# Patient Record
Sex: Male | Born: 1965 | Race: Black or African American | Hispanic: No | Smoking: Current every day smoker
Health system: Southern US, Community
[De-identification: ages and names within clinical notes are randomized; demographics above are authoritative.]

## PROBLEM LIST (undated history)

## (undated) HISTORY — PX: LEG SURGERY: SHX1003

---

## 2006-04-17 ENCOUNTER — Inpatient Hospital Stay (HOSPITAL_COMMUNITY): Admission: EM | Admit: 2006-04-17 | Discharge: 2006-04-19 | Payer: Self-pay | Admitting: Emergency Medicine

## 2006-04-23 ENCOUNTER — Ambulatory Visit (HOSPITAL_COMMUNITY): Admission: RE | Admit: 2006-04-23 | Discharge: 2006-04-24 | Payer: Self-pay | Admitting: Orthopedic Surgery

## 2006-06-18 ENCOUNTER — Encounter: Admission: RE | Admit: 2006-06-18 | Discharge: 2006-07-20 | Payer: Self-pay | Admitting: Orthopedic Surgery

## 2006-08-24 ENCOUNTER — Encounter: Admission: RE | Admit: 2006-08-24 | Discharge: 2006-08-24 | Payer: Self-pay | Admitting: Orthopedic Surgery

## 2007-02-08 ENCOUNTER — Emergency Department (HOSPITAL_COMMUNITY): Admission: EM | Admit: 2007-02-08 | Discharge: 2007-02-08 | Payer: Self-pay | Admitting: Emergency Medicine

## 2007-06-10 IMAGING — CR DG TIBIA/FIBULA PORT 2V*L*
2 series · 2 of 2 positions shown · non-contrast
Comparison: none

HISTORY: Tibial plateau fracture status post ORIF

[view not recorded (1 of 2)]
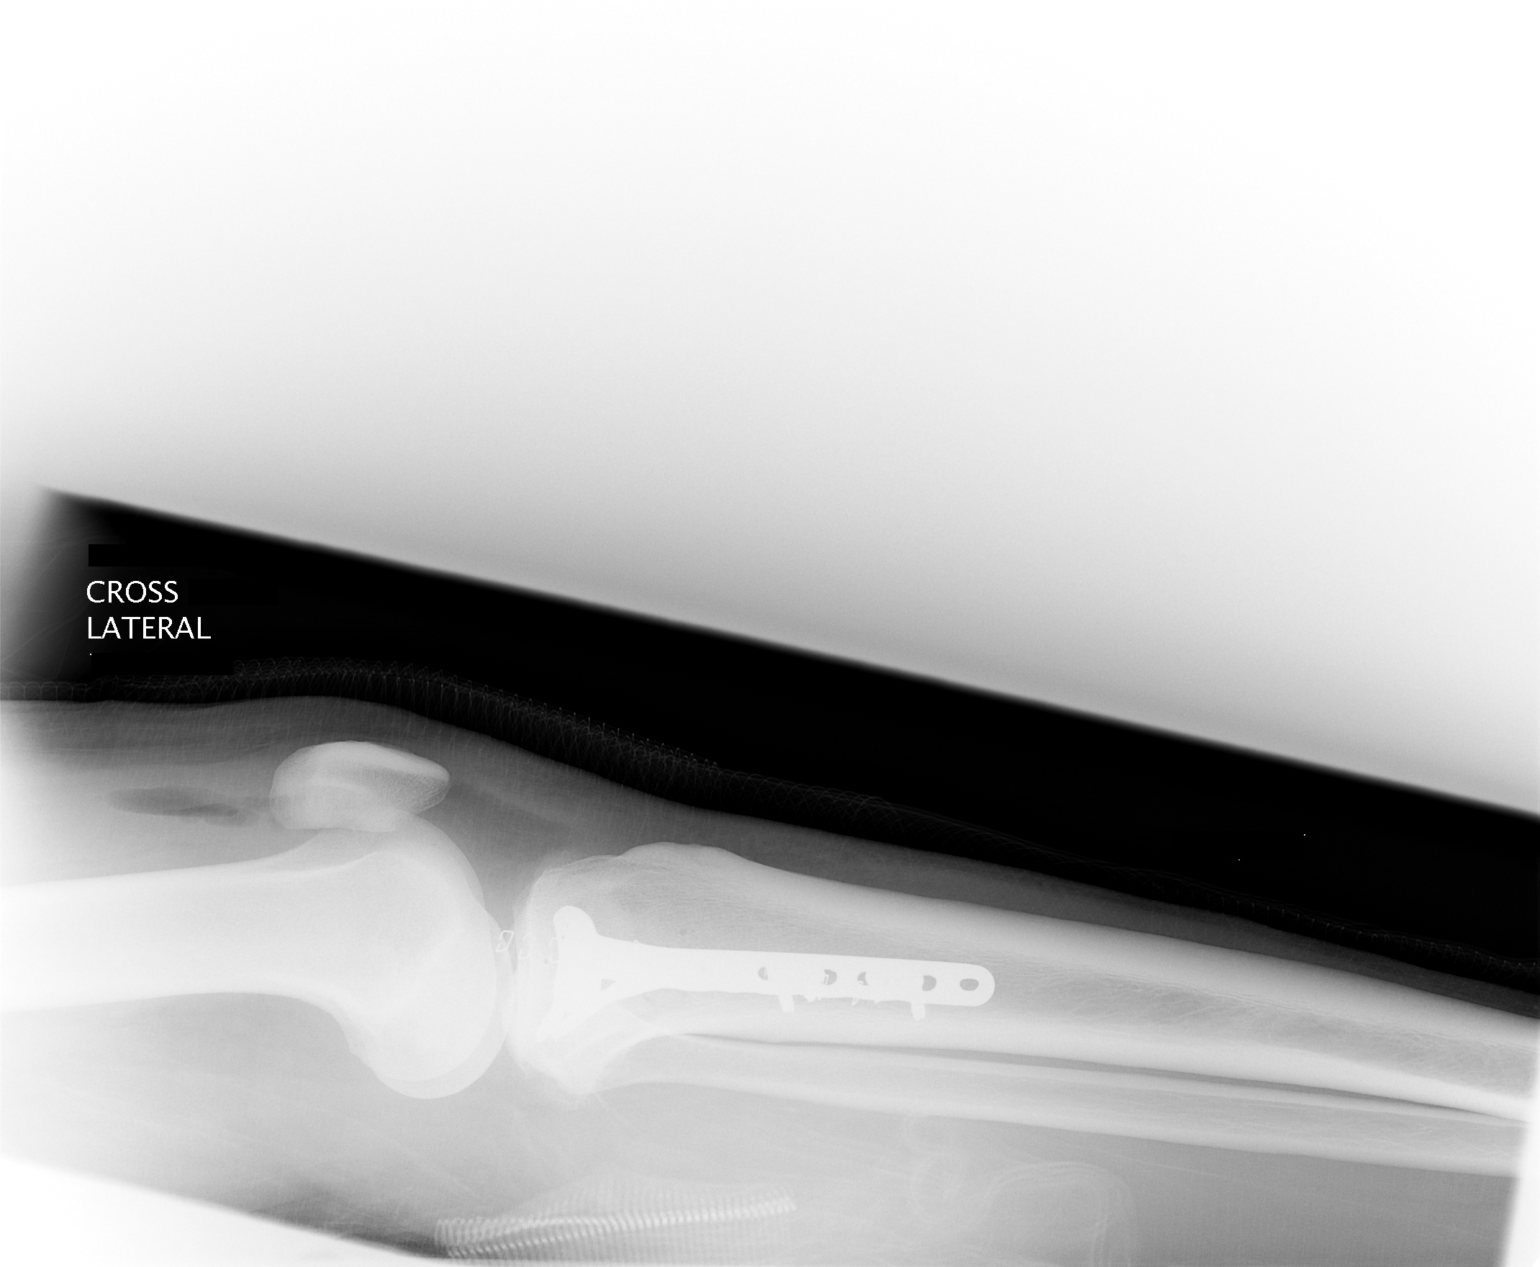

[view not recorded (2 of 2)]
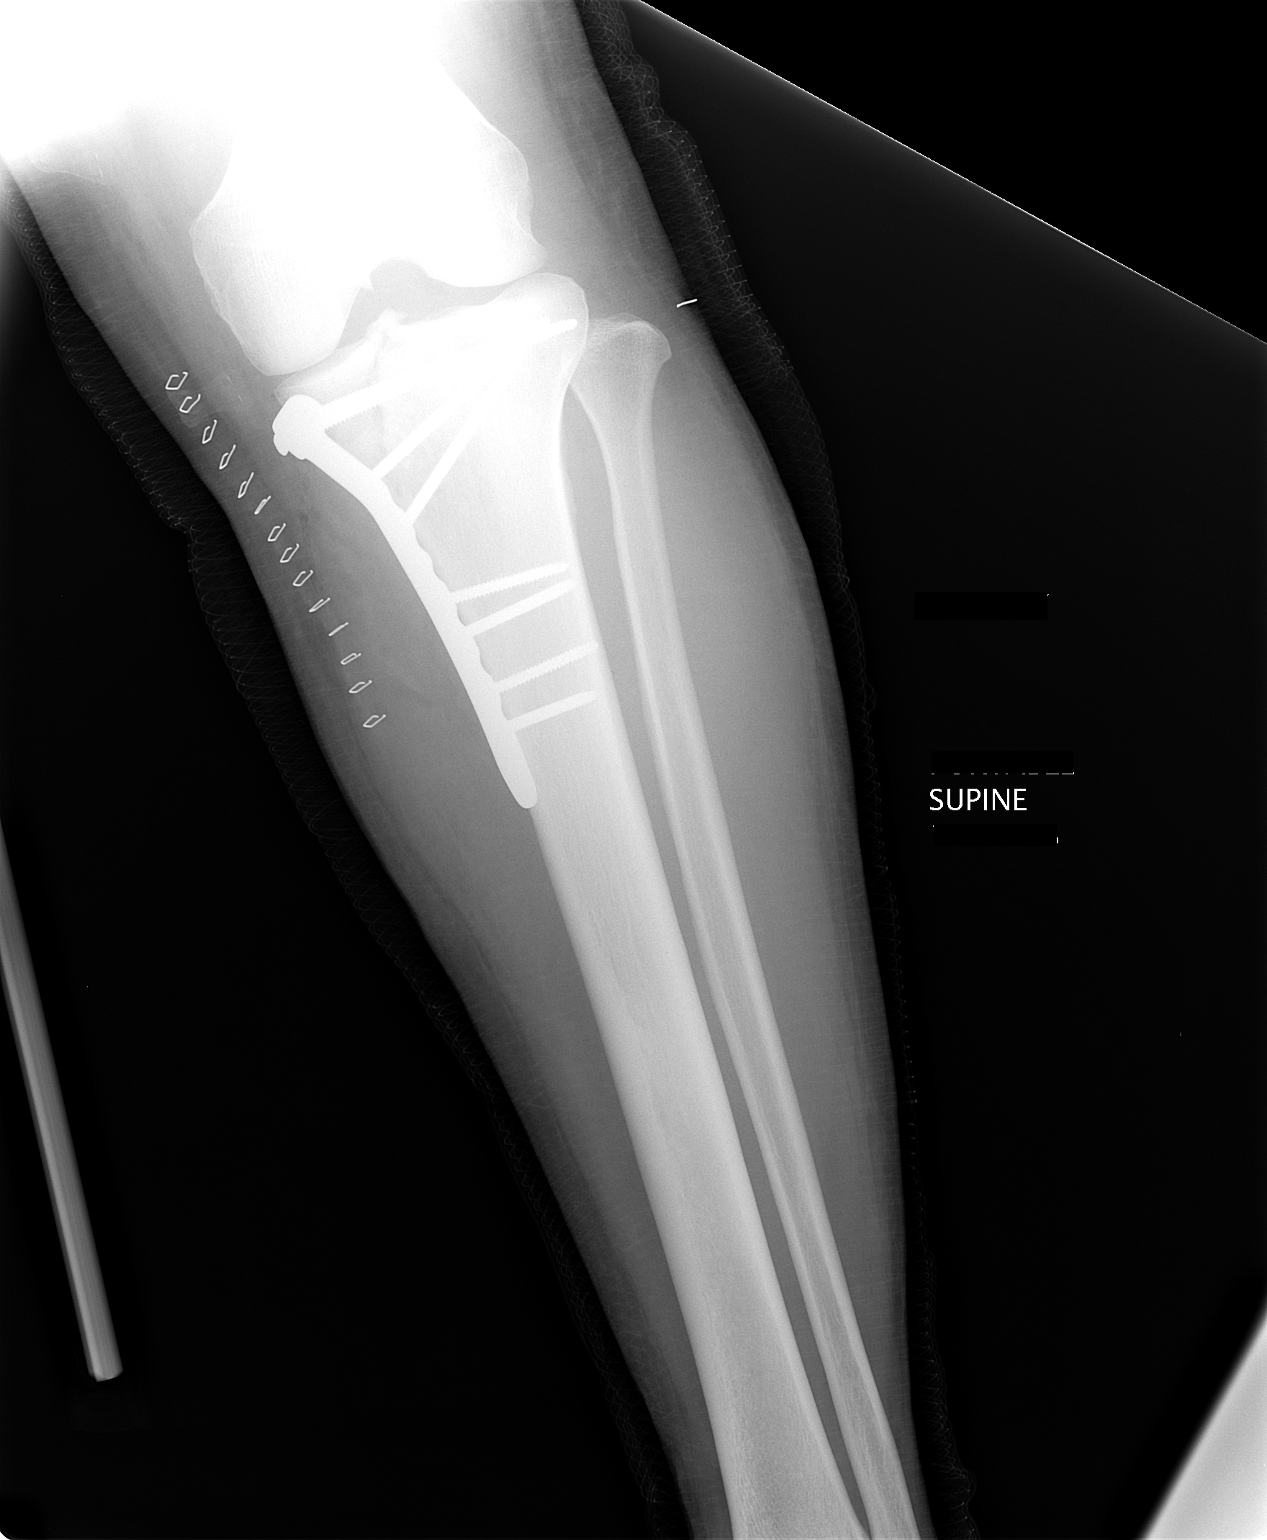

[2 of 2 positions shown; findings below may reference images not displayed]

LEFT TIBIA AND FIBULA 2 VIEWS PORTABLE:

Portal exam 2421 hours.
Medial buttress plate and 7 screws across reduced tibial plateau fracture.
Overlying skin clips and soft tissue surgical changes.
Small amount of gas seen within knee joint compatible with surgery.
No additional fracture seen
Question bone fragment projecting at intercondylar notch on AP view.
IMPRESSION: Status post ORIF of tibial plateau fracture.
Question intra-articular bone fragment at intercondylar notch.
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
## 2011-09-22 ENCOUNTER — Emergency Department (HOSPITAL_COMMUNITY): Payer: Self-pay

## 2011-09-22 ENCOUNTER — Emergency Department (HOSPITAL_COMMUNITY)
Admission: EM | Admit: 2011-09-22 | Discharge: 2011-09-22 | Disposition: A | Discharge status: Home / Self Care | Payer: Self-pay | Attending: Emergency Medicine | Admitting: Emergency Medicine

## 2011-09-22 ENCOUNTER — Encounter: Payer: Self-pay | Admitting: *Deleted

## 2011-09-22 DIAGNOSIS — G8929 Other chronic pain: Secondary | ICD-10-CM | POA: Insufficient documentation

## 2011-09-22 DIAGNOSIS — M79609 Pain in unspecified limb: Secondary | ICD-10-CM | POA: Insufficient documentation

## 2011-09-22 DIAGNOSIS — M25569 Pain in unspecified knee: Secondary | ICD-10-CM | POA: Insufficient documentation

## 2011-09-22 MED ORDER — HYDROCODONE-ACETAMINOPHEN 5-325 MG PO TABS: 1.0000 | ORAL_TABLET | ORAL | Status: AC | PRN

## 2011-09-22 NOTE — ED Provider Notes (Signed)
History     CSN: [id number]  Arrival date & time 09/22/11  1243   First MD Initiated Contact with Patient 09/22/11 1511      Chief Complaint  Patient presents with  . Leg Pain    (Consider location/radiation/quality/duration/timing/severity/associated sxs/prior treatment) Patient is a 45 y.o. male presenting with leg pain. The history is provided by the patient. No language interpreter was used.  Leg Pain  The incident occurred more than 1 week ago. The incident occurred at home. There was no injury mechanism. The pain is present in the left leg and left knee. The quality of the pain is described as aching. The pain is severe. The pain has been fluctuating since onset. Pertinent negatives include no inability to bear weight, no loss of motion and no loss of sensation. He reports no foreign bodies present. He has tried nothing for the symptoms. The treatment provided no relief.  Patient reports knee surgery in 2008.  Has been experiencing chronic pain.  Recent exacerbation of worsening pain.  History reviewed. No pertinent past medical history.  History reviewed. No pertinent past surgical history.  History reviewed. No pertinent family history.  History  Substance Use Topics  . Smoking status: Not on file  . Smokeless tobacco: Not on file  . Alcohol Use: No      Review of Systems  All other systems reviewed and are negative.    Allergies  Penicillins  Home Medications  No current outpatient prescriptions on file.  BP 150/132  Pulse 80  Temp(Src) 98 F (36.7 C) (Oral)  Resp 16  SpO2 96%  Physical Exam  Nursing note and vitals reviewed. Constitutional: He is oriented to person, place, and time. He appears well-developed and well-nourished.  HENT:  Head: Normocephalic.  Eyes: Conjunctivae are normal. Pupils are equal, round, and reactive to light.  Neck: Normal range of motion. Neck supple.  Cardiovascular: Normal rate, regular rhythm and normal heart sounds.    Pulmonary/Chest: Effort normal and breath sounds normal.  Abdominal: Soft. Bowel sounds are normal.  Musculoskeletal: Normal range of motion.  Neurological: He is alert and oriented to person, place, and time.  Skin: Skin is warm.  Psychiatric: He has a normal mood and affect.    ED Course  Procedures (including critical care time)  Labs Reviewed - No data to display Dg Knee Complete 4 Views Left  09/22/2011  *RADIOLOGY REPORT*  Clinical Data: Recurrent left knee pain.  Remote prior fixation  LEFT KNEE - COMPLETE 4+ VIEW  Comparison: 02/08/2007  Findings: Evidence of side plate and screw fixation of the proximal left tibia again noted.  Healed medial tibial plateau fracture deformity noted.  No significant change in appearance off hardware or evidence for failure.  No new fracture or dislocation.  Moderate tricompartmental degenerative change noted.  IMPRESSION: Moderate tricompartmental degenerative change without acute abnormality.  Original Report Authenticated By: Harrel Lemon, M.D.     1. Chronic knee pain     4:16 PM Radiology results reviewed and discussed with patient.  No acute cause of symptoms identified.  Patient asked to follow-up with his surgeon for continuing pain management.  Resource information provided to locate a primary care provider.  MDM          Jimmye Norman, NP 09/22/11 [phone]
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
## 2011-09-23 NOTE — ED Provider Notes (Signed)
Medical screening examination/treatment/procedure(s) were performed by non-physician practitioner and as supervising physician I was immediately available for consultation/collaboration.  Doug Sou, MD 09/23/11 1191

## 2011-10-29 ENCOUNTER — Encounter (HOSPITAL_COMMUNITY): Payer: Self-pay | Admitting: *Deleted

## 2011-10-29 ENCOUNTER — Emergency Department (HOSPITAL_COMMUNITY)
Admission: EM | Admit: 2011-10-29 | Discharge: 2011-10-29 | Disposition: A | Discharge status: Home / Self Care | Payer: Self-pay | Attending: Emergency Medicine | Admitting: Emergency Medicine

## 2011-10-29 DIAGNOSIS — M25569 Pain in unspecified knee: Secondary | ICD-10-CM | POA: Insufficient documentation

## 2011-10-29 DIAGNOSIS — G8929 Other chronic pain: Secondary | ICD-10-CM | POA: Insufficient documentation

## 2011-10-29 MED ORDER — HYDROCODONE-ACETAMINOPHEN 5-325 MG PO TABS: 1.0000 | ORAL_TABLET | ORAL | Status: AC | PRN
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
## 2011-10-29 NOTE — ED Notes (Signed)
Patient verbalized complete understanding of all the d/c home instructions including medication administration
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
## 2012-01-04 ENCOUNTER — Emergency Department (HOSPITAL_COMMUNITY)
Admission: EM | Admit: 2012-01-04 | Discharge: 2012-01-05 | Disposition: A | Discharge status: Home / Self Care | Payer: Self-pay | Attending: Emergency Medicine | Admitting: Emergency Medicine

## 2012-01-04 ENCOUNTER — Encounter (HOSPITAL_COMMUNITY): Payer: Self-pay | Admitting: *Deleted

## 2012-01-04 DIAGNOSIS — L03019 Cellulitis of unspecified finger: Secondary | ICD-10-CM | POA: Insufficient documentation

## 2012-01-04 DIAGNOSIS — IMO0002 Reserved for concepts with insufficient information to code with codable children: Secondary | ICD-10-CM
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
## 2012-01-05 MED ORDER — HYDROCODONE-ACETAMINOPHEN 5-325 MG PO TABS: 1.0000 | ORAL_TABLET | ORAL | Status: AC | PRN

## 2012-01-05 NOTE — Discharge Instructions (Signed)
You were seen and treated today for an infection under the nail bed on your finger. This was drained. It is recommended that he use warm soaks 4-5 times a day for 20-30 minutes to help increase blood flow to reduce swelling and pain.  Paronychia Paronychia is an inflammatory reaction involving the folds of the skin surrounding the fingernail. This is commonly caused by an infection in the skin around a nail. The most common cause of paronychia is frequent wetting of the hands (as seen with bartenders, food servers, nurses or others who wet their hands). This makes the skin around the fingernail susceptible to infection by bacteria (germs) or fungus. Other predisposing factors are:  Aggressive manicuring.   Nail biting.   Thumb sucking.  The most common cause is a staphylococcal (a type of germ) infection, or a fungal (Candida) infection. When caused by a germ, it usually comes on suddenly with redness, swelling, pus and is often painful. It may get under the nail and form an abscess (collection of pus), or form an abscess around the nail. If the nail itself is infected with a fungus, the treatment is usually prolonged and may require oral medicine for up to one year. Your caregiver will determine the length of time treatment is required. The paronychia caused by bacteria (germs) may largely be avoided by not pulling on hangnails or picking at cuticles. When the infection occurs at the tips of the finger it is called felon. When the cause of paronychia is from the herpes simplex virus (HSV) it is called herpetic whitlow. TREATMENT  When an abscess is present treatment is often incision and drainage. This means that the abscess must be cut open so the pus can get out. When this is done, the following home care instructions should be followed. HOME CARE INSTRUCTIONS   It is important to keep the affected fingers very dry. Rubber or plastic gloves over cotton gloves should be used whenever the hand must  be placed in water.   Keep wound clean, dry and dressed as suggested by your caregiver between warm soaks or warm compresses.   Soak in warm water for fifteen to twenty minutes three to four times per day for bacterial infections. Fungal infections are very difficult to treat, so often require treatment for long periods of time.   For bacterial (germ) infections take antibiotics (medicine which kill germs) as directed and finish the prescription, even if the problem appears to be solved before the medicine is gone.   Only take over-the-counter or prescription medicines for pain, discomfort, or fever as directed by your caregiver.  SEEK IMMEDIATE MEDICAL CARE IF:  You have redness, swelling, or increasing pain in the wound.   You notice pus coming from the wound.   You have a fever.   You notice a bad smell coming from the wound or dressing.  Document Released: 03/04/2001 Document Revised: 08/28/2011 Document Reviewed: 11/03/2008 [facility] Patient Information 2012 [place], [place].   RESOURCE GUIDE  Dental Problems  Patients with Medicaid: [facility] [phone]                                           [phone] [address] Phone:  [phone]  Phone:  [phone]  If unable to pay or uninsured, contact:  [facility] or [facility]. to become qualified for the adult dental clinic.  Chronic Pain Problems Contact [facility]  [phone] Patients need to be referred by their primary care doctor.  Insufficient Money for Medicine Contact [employer]:  call "211" or [facility] 9200757441.  No Primary Care Doctor Call [facility]  570-536-2004 Other agencies that provide inexpensive medical care    [facility]  [phone]    [facility]  [phone]    [facility]  [phone]    [facility]   [phone]    [facility]  [phone]    [facility]  [phone]  Psychological Services [facility]  [phone] [facility]  [phone] [facility]   [phone] (emergency services [phone])  Substance Abuse Resources [facility]  548-842-2231 [facility] 734-428-0752 [facility] 380-355-4255 Floydene Flock 412-301-2713 Residential & Outpatient Substance Abuse Program  813-789-6463  Abuse/Neglect [facility] Child Abuse Hotline 860-717-6844 [facility] Child Abuse Hotline 207-710-2475 (After Hours)  Emergency Shelter [facility] (301)642-0129  Maternity Homes Room at the [place] [facility] (816) 107-3521 [facility] (410)[phone]  MRSA Hotline #:   (903) 660-8846    [facility] Resources  [facility]     [employer]                          [facility] Dept. [address] [place]                       [address]      [address]  Blondell Reveal Phone:  [phone]                                   Phone:  [phone]                 Phone:  [phone]  [facility] Phone:  [phone]  [facility] Child Abuse Hotline 508-846-4689 [phone])[phone] (After Hours)
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
## 2012-01-06 NOTE — ED Provider Notes (Signed)
Medical screening examination/treatment/procedure(s) were performed by non-physician practitioner and as supervising physician I was immediately available for consultation/collaboration.   Nat Christen, MD 01/06/12 1013

## 2012-11-01 ENCOUNTER — Emergency Department (HOSPITAL_COMMUNITY)
Admission: EM | Admit: 2012-11-01 | Discharge: 2012-11-01 | Disposition: A | Discharge status: Home / Self Care | Payer: No Typology Code available for payment source | Attending: Emergency Medicine | Admitting: Emergency Medicine

## 2012-11-01 ENCOUNTER — Emergency Department (HOSPITAL_COMMUNITY): Payer: No Typology Code available for payment source

## 2012-11-01 ENCOUNTER — Encounter (HOSPITAL_COMMUNITY): Payer: Self-pay | Admitting: Emergency Medicine

## 2012-11-01 DIAGNOSIS — F17200 Nicotine dependence, unspecified, uncomplicated: Secondary | ICD-10-CM | POA: Insufficient documentation

## 2012-11-01 DIAGNOSIS — S99919A Unspecified injury of unspecified ankle, initial encounter: Secondary | ICD-10-CM | POA: Insufficient documentation

## 2012-11-01 DIAGNOSIS — Y9389 Activity, other specified: Secondary | ICD-10-CM | POA: Insufficient documentation

## 2012-11-01 DIAGNOSIS — Y92410 Unspecified street and highway as the place of occurrence of the external cause: Secondary | ICD-10-CM | POA: Insufficient documentation

## 2012-11-01 DIAGNOSIS — M25569 Pain in unspecified knee: Secondary | ICD-10-CM

## 2012-11-01 DIAGNOSIS — S8990XA Unspecified injury of unspecified lower leg, initial encounter: Secondary | ICD-10-CM | POA: Insufficient documentation

## 2012-11-01 MED ORDER — NAPROXEN 500 MG PO TABS: 500.0000 mg | ORAL_TABLET | Freq: Two times a day (BID) | ORAL | Status: DC

## 2012-11-01 MED ORDER — IBUPROFEN 400 MG PO TABS
400.0000 mg | ORAL_TABLET | Freq: Once | ORAL | Status: AC
  Administered 2012-11-01: 400 mg via ORAL
  Filled 2012-11-01: qty 1

## 2012-11-01 MED ORDER — PERCOCET 5-325 MG PO TABS: 1.0000 | ORAL_TABLET | Freq: Four times a day (QID) | ORAL | Status: DC | PRN

## 2012-11-01 MED ORDER — HYDROCODONE-ACETAMINOPHEN 5-325 MG PO TABS
1.0000 | ORAL_TABLET | Freq: Once | ORAL | Status: AC
  Administered 2012-11-01: 1 via ORAL
  Filled 2012-11-01: qty 1
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
## 2012-11-03 NOTE — ED Provider Notes (Signed)
Medical screening examination/treatment/procedure(s) were performed by non-physician practitioner and as supervising physician I was immediately available for consultation/collaboration.   Laray Anger, DO 11/03/12 1555

## 2014-02-07 ENCOUNTER — Other Ambulatory Visit (HOSPITAL_COMMUNITY)
Admission: RE | Admit: 2014-02-07 | Discharge: 2014-02-07 | Disposition: A | Discharge status: Home / Self Care | Payer: Self-pay | Source: Ambulatory Visit | Attending: Family Medicine | Admitting: Family Medicine

## 2014-02-07 ENCOUNTER — Encounter (HOSPITAL_COMMUNITY): Payer: Self-pay | Admitting: Emergency Medicine

## 2014-02-07 ENCOUNTER — Emergency Department (INDEPENDENT_AMBULATORY_CARE_PROVIDER_SITE_OTHER)
Admission: EM | Admit: 2014-02-07 | Discharge: 2014-02-07 | Disposition: A | Discharge status: Home / Self Care | Payer: Self-pay | Source: Home / Self Care | Attending: Family Medicine | Admitting: Family Medicine

## 2014-02-07 DIAGNOSIS — A64 Unspecified sexually transmitted disease: Secondary | ICD-10-CM

## 2014-02-07 DIAGNOSIS — Z113 Encounter for screening for infections with a predominantly sexual mode of transmission: Secondary | ICD-10-CM | POA: Insufficient documentation

## 2014-02-07 MED ORDER — AZITHROMYCIN 250 MG PO TABS
1000.0000 mg | ORAL_TABLET | Freq: Once | ORAL | Status: AC
  Administered 2014-02-07: 1000 mg via ORAL

## 2014-02-07 MED ORDER — CEFTRIAXONE SODIUM 250 MG IJ SOLR
INTRAMUSCULAR | Status: AC
  Filled 2014-02-07: qty 250

## 2014-02-07 MED ORDER — CEFTRIAXONE SODIUM 1 G IJ SOLR
250.0000 mg | Freq: Once | INTRAMUSCULAR | Status: AC
  Administered 2014-02-07: 250 mg via INTRAMUSCULAR

## 2014-02-07 MED ORDER — AZITHROMYCIN 250 MG PO TABS
ORAL_TABLET | ORAL | Status: AC
  Filled 2014-02-07: qty 4

## 2014-02-07 NOTE — Discharge Instructions (Signed)
We will call with positive test results and treat as indicated  °

## 2014-02-07 NOTE — ED Provider Notes (Signed)
CSN: [id number]     Arrival date & time 02/07/14  1542 History   First MD Initiated Contact with Patient 02/07/14 1610     Chief Complaint  Patient presents with  . Penile Discharge   (Consider location/radiation/quality/duration/timing/severity/associated sxs/prior Treatment) Patient is a 48 y.o. male presenting with penile discharge. The history is provided by the patient.  Penile Discharge This is a new problem. The current episode started more than 2 days ago. The problem has been gradually worsening.    History reviewed. No pertinent past medical history. History reviewed. No pertinent past surgical history. History reviewed. No pertinent family history. History  Substance Use Topics  . Smoking status: Current Every Day Smoker  . Smokeless tobacco: Not on file  . Alcohol Use: Yes    Review of Systems  Constitutional: Negative.   Gastrointestinal: Negative.   Genitourinary: Positive for dysuria and discharge. Negative for penile swelling, scrotal swelling, penile pain and testicular pain.    Allergies  Penicillins  Home Medications   Prior to Admission medications   Medication Sig Start Date End Date Taking? Authorizing Provider  naproxen (NAPROSYN) 500 MG tablet Take 1 tablet (500 mg total) by mouth 2 (two) times daily. 11/01/12   Lisette Paz, PA-C  PERCOCET 5-325 MG per tablet Take 1 tablet by mouth every 6 (six) hours as needed for pain. 11/01/12   Lisette Paz, PA-C   BP 138/89  Pulse 80  Temp(Src) 97.9 F (36.6 C) (Oral)  Resp 16  SpO2 98% Physical Exam  Nursing note and vitals reviewed. Constitutional: He is oriented to person, place, and time. He appears well-developed and well-nourished.  Genitourinary: Testes normal. Cremasteric reflex is present. Circumcised. Discharge found.  Neurological: He is alert and oriented to person, place, and time.  Skin: Skin is warm and dry.    ED Course  Procedures (including critical care time) Labs Review Labs  Reviewed  CYTOLOGY, (ORAL, ANAL, URETHRAL) ANCILLARY ONLY    Imaging Review No results found.   MDM   1. STD (male)        Linna HoffJames D Kindl, MD 02/07/14 [phone]

## 2014-02-07 NOTE — ED Notes (Signed)
Pt  Reports     Penile  Discharge  X  3  Days  Pt  Reports  Condom  May  Have  Broken

## 2014-02-09 ENCOUNTER — Telehealth (HOSPITAL_COMMUNITY): Payer: Self-pay | Admitting: *Deleted
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
## 2016-04-19 ENCOUNTER — Encounter (HOSPITAL_COMMUNITY): Payer: Self-pay | Admitting: Emergency Medicine

## 2016-04-19 ENCOUNTER — Emergency Department (HOSPITAL_COMMUNITY)
Admission: EM | Admit: 2016-04-19 | Discharge: 2016-04-19 | Disposition: A | Discharge status: Home / Self Care | Payer: Self-pay | Attending: Emergency Medicine | Admitting: Emergency Medicine

## 2016-04-19 ENCOUNTER — Emergency Department (HOSPITAL_COMMUNITY): Payer: Self-pay

## 2016-04-19 DIAGNOSIS — F17200 Nicotine dependence, unspecified, uncomplicated: Secondary | ICD-10-CM | POA: Insufficient documentation

## 2016-04-19 DIAGNOSIS — N503 Cyst of epididymis: Secondary | ICD-10-CM | POA: Insufficient documentation

## 2016-04-19 DIAGNOSIS — N5089 Other specified disorders of the male genital organs: Secondary | ICD-10-CM

## 2016-04-19 DIAGNOSIS — Z79899 Other long term (current) drug therapy: Secondary | ICD-10-CM | POA: Insufficient documentation

## 2016-04-19 LAB — URINALYSIS, ROUTINE W REFLEX MICROSCOPIC
BILIRUBIN URINE: NEGATIVE
Glucose, UA: NEGATIVE mg/dL
Hgb urine dipstick: NEGATIVE
KETONES UR: NEGATIVE mg/dL
LEUKOCYTES UA: NEGATIVE
NITRITE: NEGATIVE
PROTEIN: NEGATIVE mg/dL
Specific Gravity, Urine: 1.027 (ref 1.005–1.030)
pH: 5.5 (ref 5.0–8.0)

## 2016-04-19 LAB — CBC WITH DIFFERENTIAL/PLATELET
BASOS ABS: 0 10*3/uL (ref 0.0–0.1)
BASOS PCT: 0 %
EOS ABS: 0.2 10*3/uL (ref 0.0–0.7)
Eosinophils Relative: 3 %
HEMATOCRIT: 43.3 % (ref 39.0–52.0)
HEMOGLOBIN: 14 g/dL (ref 13.0–17.0)
Lymphocytes Relative: 46 %
Lymphs Abs: 3.5 10*3/uL (ref 0.7–4.0)
MCH: 28.5 pg (ref 26.0–34.0)
MCHC: 32.3 g/dL (ref 30.0–36.0)
MCV: 88.2 fL (ref 78.0–100.0)
Monocytes Absolute: 0.5 10*3/uL (ref 0.1–1.0)
Monocytes Relative: 7 %
NEUTROS ABS: 3.4 10*3/uL (ref 1.7–7.7)
NEUTROS PCT: 44 %
Platelets: 249 10*3/uL (ref 150–400)
RBC: 4.91 MIL/uL (ref 4.22–5.81)
RDW: 14.8 % (ref 11.5–15.5)
WBC: 7.6 10*3/uL (ref 4.0–10.5)

## 2016-04-19 LAB — BASIC METABOLIC PANEL
ANION GAP: 8 (ref 5–15)
BUN: 19 mg/dL (ref 6–20)
CHLORIDE: 108 mmol/L (ref 101–111)
CO2: 24 mmol/L (ref 22–32)
CREATININE: 0.97 mg/dL (ref 0.61–1.24)
Calcium: 9.1 mg/dL (ref 8.9–10.3)
GFR calc non Af Amer: >60 mL/min (ref >60)
Glucose, Bld: 117 mg/dL — ABNORMAL HIGH (ref 65–99)
Potassium: 4 mmol/L (ref 3.5–5.1)
SODIUM: 140 mmol/L (ref 135–145)

## 2016-04-19 MED ORDER — STERILE WATER FOR INJECTION IJ SOLN
INTRAMUSCULAR | Status: AC
  Administered 2016-04-19: 10 mL
  Filled 2016-04-19: qty 10

## 2016-04-19 MED ORDER — CEFTRIAXONE SODIUM 250 MG IJ SOLR
250.0000 mg | Freq: Once | INTRAMUSCULAR | Status: AC
  Administered 2016-04-19: 250 mg via INTRAMUSCULAR
  Filled 2016-04-19: qty 250

## 2016-04-19 MED ORDER — IBUPROFEN 600 MG PO TABS: 600.0000 mg | ORAL_TABLET | Freq: Four times a day (QID) | ORAL | 0 refills | Status: DC | PRN

## 2016-04-19 MED ORDER — AZITHROMYCIN 250 MG PO TABS
1000.0000 mg | ORAL_TABLET | Freq: Once | ORAL | Status: AC
  Administered 2016-04-19: 1000 mg via ORAL
  Filled 2016-04-19: qty 4

## 2016-04-19 NOTE — ED Notes (Signed)
Pt given urinal to use for urinalysis.

## 2016-04-19 NOTE — Discharge Instructions (Signed)
You were seen and evaluated today for your mass in your scrotum.  This appears to be what is called an epididymal cyst which is usually benign but sometimes need to be removed if they are causing pain.  Please, follow up outpatient with a urologist.  Use the Ibuprofen as needed for pain.

## 2016-04-19 NOTE — ED Provider Notes (Signed)
[facility]EMERGENCY DEPT Provider Note   CSN: [id number] Arrival date & time: 04/19/16  1478  First Provider Contact:  First MD Initiated Contact with Patient 04/19/16 [phone]        History   Chief Complaint Chief Complaint  Patient presents with  . Testicular Mass    HPI Derek Huynh is a 50 y.o. male.  50 y.o. Male with no significant past medical history presents for concern for mass in left testicle x 1 month.  The patient reports that over the last month he has noted what feels like a second testicle in the left scrotum. He reports that intermittently he feels pain that can sometimes come on suddenly.  He denies any penile discharge.  No penile swelling.  No rashes.  Patient does report that he was lifting a lot before he developed the scrotal mass.  He denies associated vomiting.  Normal bowel habits.  Denies ever having this issue before the last month.  Patient also notes that he has intermittently felt dizzy like the room is spinning over the last month but denies headache, nausea, vomiting, focal weakness, ear pain or hearing changes.        History reviewed. No pertinent past medical history.  There are no active problems to display for this patient.   Past Surgical History:  Procedure Laterality Date  . LEG SURGERY Left        Home Medications    Prior to Admission medications   Medication Sig Start Date End Date Taking? Authorizing Provider  naphazoline-glycerin (CLEAR EYES) 0.012-0.2 % SOLN Place 1-2 drops into both eyes 4 (four) times daily as needed for irritation.   Yes Historical Provider, MD  ibuprofen (ADVIL,MOTRIN) 600 MG tablet Take 1 tablet (600 mg total) by mouth every 6 (six) hours as needed. 04/19/16   Leta Baptist, MD    Family History No family history on file.  Social History Social History  Substance Use Topics  . Smoking status: Current Every Day Smoker  . Smokeless tobacco: Never Used  . Alcohol use Yes     Allergies     Penicillins   Review of Systems Review of Systems  Constitutional: Negative for appetite change, diaphoresis, fatigue and fever.  HENT: Negative for congestion, postnasal drip, rhinorrhea, sinus pressure and voice change.   Eyes: Negative for visual disturbance.  Respiratory: Negative for cough, chest tightness, shortness of breath and wheezing.   Cardiovascular: Negative for chest pain and palpitations.  Gastrointestinal: Negative for abdominal pain, diarrhea, nausea and vomiting.  Genitourinary: Positive for scrotal swelling (intermittent) and testicular pain (intermittent). Negative for decreased urine volume, difficulty urinating, discharge, dysuria, flank pain, frequency, penile pain and urgency.  Musculoskeletal: Negative for back pain and myalgias.  Skin: Negative for rash.  Neurological: Positive for dizziness (intermittent). Negative for seizures, syncope, speech difficulty, weakness, light-headedness, numbness and headaches.  Hematological: Does not bruise/bleed easily.     Physical Exam Updated Vital Signs BP 140/94   Pulse 65   Temp 98.3 F (36.8 C) (Oral)   Resp 16   Ht 5\' 7"  (1.702 m)   Wt 194 lb (88 kg)   SpO2 99%   BMI 30.38 kg/m   Physical Exam  Constitutional: He appears well-developed and well-nourished.  HENT:  Head: Normocephalic and atraumatic.  Eyes: Conjunctivae are normal.  Neck: Neck supple.  Cardiovascular: Normal rate and regular rhythm.   No murmur heard. Pulmonary/Chest: Effort normal and breath sounds normal. No respiratory distress.  Abdominal: Soft. There is  no tenderness. A hernia is present. Hernia confirmed positive in the left inguinal area (possible small hernia).  Genitourinary: Penis normal. Right testis shows no mass, no swelling and no tenderness. Right testis is descended. Cremasteric reflex is not absent on the right side. Left testis shows mass (second round mass in the upper scrotum which is not compressible on my examination)  and tenderness (mild over the mass). Left testis shows no swelling. Left testis is descended. Cremasteric reflex is not absent on the left side. Circumcised.  Musculoskeletal: He exhibits no edema.  Neurological: He is alert. He has normal strength. No cranial nerve deficit or sensory deficit. He exhibits normal muscle tone. Coordination normal.  Skin: Skin is warm and dry.  Psychiatric: He has a normal mood and affect.  Nursing note and vitals reviewed.    ED Treatments / Results  Labs (all labs ordered are listed, but only abnormal results are displayed) Labs Reviewed  BASIC METABOLIC PANEL - Abnormal; Notable for the following:       Result Value   Glucose, Bld 117 (*)    All other components within normal limits  URINALYSIS, ROUTINE W REFLEX MICROSCOPIC (NOT AT [facility])  CBC WITH [employer]FF[employer] IncRENTIAL/PLATELET  GC/CHLAMYDIA PROBE AMP ([place]) NOT AT [facility]    EKG  EKG Interpretation  Date/Time:  Saturday April 19 2016 07:45:22 EDT Ventricular Rate:  54 PR Interval:    QRS Duration: 92 QT Interval:  413 QTC Calculation: 392 R Axis:   49 Text Interpretation:  Sinus rhythm No previous ECGs available Confirmed by NGUYEN, EMILY ([id number]) on 04/19/2016 8:35:35 AM       Radiology US Scrotum  Result Date: 04/19/2016 CLINICAL DATA:  Left scrotal lump EXAM: SCROTAL ULTRASOUND DOPPLER ULTRASOUND OF THE TESTICLES TECHNIQUE: Complete ultrasound examination of the testicles, epididymis, and other scrotal structures was performed. Color and spectral Doppler ultrasound were also utilized to evaluate blood flow to the testicles. COMPARISON:  None. FINDINGS: Right testicle Measurements: 4 x 2.2 x 2.9 cm. No mass or microlithiasis visualized. Left testicle Measurements: 4.1 x 2.5 x 3.3 cm. No mass or microlithiasis visualized. Right epididymis:  Normal in size and appearance. Left epididymis:  3.4 x 2.1 x 3.2 cm cyst in the left epididymis. Hydrocele:  Small hydroceles, right greater than left.  Varicocele:  None visualized. Pulsed Doppler interrogation of both testes demonstrates normal low resistance arterial and venous waveforms bilaterally. IMPRESSION: 1. There is a large left epididymal cyst, likely accounting for the lump. Small hydroceles. No other acute abnormalities. Electronically Signed   By: Gerome Sam III M.D   On: 04/19/2016 08:45  [place] Art/ven Flow Abd Pelv Doppler  Result Date: 04/19/2016 CLINICAL DATA:  Left scrotal lump EXAM: SCROTAL ULTRASOUND DOPPLER ULTRASOUND OF THE TESTICLES TECHNIQUE: Complete ultrasound examination of the testicles, epididymis, and other scrotal structures was performed. Color and spectral Doppler ultrasound were also utilized to evaluate blood flow to the testicles. COMPARISON:  None. FINDINGS: Right testicle Measurements: 4 x 2.2 x 2.9 cm. No mass or microlithiasis visualized. Left testicle Measurements: 4.1 x 2.5 x 3.3 cm. No mass or microlithiasis visualized. Right epididymis:  Normal in size and appearance. Left epididymis:  3.4 x 2.1 x 3.2 cm cyst in the left epididymis. Hydrocele:  Small hydroceles, right greater than left. Varicocele:  None visualized. Pulsed Doppler interrogation of both testes demonstrates normal low resistance arterial and venous waveforms bilaterally. IMPRESSION: 1. There is a large left epididymal cyst, likely accounting for the lump. Small hydroceles. No other  acute abnormalities. Electronically Signed   By: Gerome Sam III M.D   On: 04/19/2016 08:45   Procedures Procedures (including critical care time)  Medications Ordered in ED Medications - No data to display   Initial Impression / Assessment and Plan / ED Course  I have reviewed the triage vital signs and the nursing notes.  Pertinent labs & imaging results that were available during my care of the patient were reviewed by me and considered in my medical decision making (see chart for details).  Clinical Course    Patient was seen and evaluated in  stable condition. Benign examination other than mass within his left scrotum. Ultrasound revealed that this is a large left epididymal cyst. No signs of torsion. Laboratory studies and EKG unremarkable. Patient not currently dizzy here. Normal neurologic examination. Patient updated on ultrasound results and need for outpatient follow-up. Patient was discharged home in stable condition.  Final Clinical Impressions(s) / ED Diagnoses   Final diagnoses:  Testicular mass  Epididymal cyst    New Prescriptions New Prescriptions   IBUPROFEN (ADVIL,MOTRIN) 600 MG TABLET    Take 1 tablet (600 mg total) by mouth every 6 (six) hours as needed.     Leta Baptist, MD 04/19/16 [phone]

## 2016-04-19 NOTE — ED Notes (Signed)
Pt. Verbalized understanding of teaching. And discharge as well as follow up care.

## 2016-04-19 NOTE — ED Triage Notes (Signed)
Pt c/o palpable mass in L testicle x 1 month with soreness. Dizziness x 2 weeks.

## 2016-04-21 LAB — GC/CHLAMYDIA PROBE AMP (~~LOC~~) NOT AT ARMC
CHLAMYDIA, DNA PROBE: NEGATIVE
NEISSERIA GONORRHEA: NEGATIVE

## 2016-11-07 ENCOUNTER — Encounter (HOSPITAL_COMMUNITY): Payer: Self-pay | Admitting: Emergency Medicine

## 2016-11-07 ENCOUNTER — Ambulatory Visit (HOSPITAL_COMMUNITY)
Admission: EM | Admit: 2016-11-07 | Discharge: 2016-11-07 | Disposition: A | Discharge status: Home / Self Care | Payer: Self-pay | Attending: Family Medicine | Admitting: Family Medicine

## 2016-11-07 DIAGNOSIS — F419 Anxiety disorder, unspecified: Secondary | ICD-10-CM

## 2016-11-07 NOTE — Discharge Instructions (Signed)
See your doctor if your bp stays elevated

## 2016-11-07 NOTE — ED Triage Notes (Signed)
Here for HTN associated w/HA for x2 weeks  Reports BP was 220/117  A&O x4... NAD

## 2016-11-07 NOTE — ED Provider Notes (Signed)
[facility]    CSN: [id number] Arrival date & time: 11/07/16  1834     History   Chief Complaint Chief Complaint  Patient presents with  . Hypertension    HPI Derek Huynh is a 51 y.o. male.   The history is provided by the patient and the spouse.  Hypertension  This is a new problem. The current episode started 2 days ago. The problem has not changed since onset.Pertinent negatives include no chest pain and no abdominal pain. Associated symptoms comments: Under a lot of stress.Marland Kitchen    History reviewed. No pertinent past medical history.  There are no active problems to display for this patient.   Past Surgical History:  Procedure Laterality Date  . LEG SURGERY Left        Home Medications    Prior to Admission medications   Medication Sig Start Date End Date Taking? Authorizing Provider  ibuprofen (ADVIL,MOTRIN) 600 MG tablet Take 1 tablet (600 mg total) by mouth every 6 (six) hours as needed. 04/19/16   Leta Baptist, MD  naphazoline-glycerin (CLEAR EYES) 0.012-0.2 % SOLN Place 1-2 drops into both eyes 4 (four) times daily as needed for irritation.    Historical Provider, MD    Family History History reviewed. No pertinent family history.  Social History Social History  Substance Use Topics  . Smoking status: Current Every Day Smoker  . Smokeless tobacco: Never Used  . Alcohol use Yes     Allergies   Penicillins   Review of Systems Review of Systems  Constitutional: Negative.   Respiratory: Negative.   Cardiovascular: Negative.  Negative for chest pain.  Gastrointestinal: Negative for abdominal pain.  Neurological: Negative.   All other systems reviewed and are negative.    Physical Exam Triage Vital Signs ED Triage Vitals [11/07/16 1923]  Enc Vitals Group     BP 122/67     Pulse Rate 73     Resp 18     Temp 97.9 F (36.6 C)     Temp Source Oral     SpO2 97 %     Weight      Height      Head Circumference      Peak Flow       Pain Score      Pain Loc      Pain Edu?      Excl. in GC?    No data found.   Updated Vital Signs BP 104/73 (BP Location: Left Arm)   Pulse 73   Temp 97.9 F (36.6 C) (Oral)   Resp 18   SpO2 97%   Visual Acuity Right Eye Distance:   Left Eye Distance:   Bilateral Distance:    Right Eye Near:   Left Eye Near:    Bilateral Near:     Physical Exam  Constitutional: He is oriented to person, place, and time. He appears well-developed and well-nourished. No distress.  Eyes: EOM are normal. Pupils are equal, round, and reactive to light.  Neck: Normal range of motion. Neck supple.  Cardiovascular: Normal rate and regular rhythm.   Pulmonary/Chest: Effort normal and breath sounds normal.  Neurological: He is alert and oriented to person, place, and time. No cranial nerve deficit.  Skin: Skin is warm.  Nursing note and vitals reviewed.    UC Treatments / Results  Labs (all labs ordered are listed, but only abnormal results are displayed) Labs Reviewed - No data to display  EKG  EKG Interpretation None       Radiology No results found.  Procedures Procedures (including critical care time)  Medications Ordered in UC Medications - No data to display   Initial Impression / Assessment and Plan / UC Course  I have reviewed the triage vital signs and the nursing notes.  Pertinent labs & imaging results that were available during my care of the patient were reviewed by me and considered in my medical decision making (see chart for details).       Final Clinical Impressions(s) / UC Diagnoses   Final diagnoses:  Anxiety    New Prescriptions New Prescriptions   No medications on file     Linna HoffJames D Kindl, MD 11/07/16 1941

## 2017-09-29 ENCOUNTER — Emergency Department (HOSPITAL_COMMUNITY): Admission: EM | Admit: 2017-09-29 | Discharge: 2017-09-29 | Discharge status: Against Medical Advice | Payer: Self-pay

## 2017-09-29 NOTE — ED Notes (Signed)
Pt called to be triage with no answer x 1

## 2017-09-29 NOTE — ED Notes (Signed)
Pt called x 2 from triage with no answer 

## 2017-09-29 NOTE — ED Notes (Signed)
Pt called multiple times inside [facility], mens bathroom, and directly outside [facility]. Pt did not respond nor was he visualized.

## 2017-09-29 NOTE — ED Notes (Signed)
Pt called to be taken to triage room x 3 with no answer

## 2017-09-30 ENCOUNTER — Emergency Department (HOSPITAL_COMMUNITY): Payer: Self-pay

## 2017-09-30 ENCOUNTER — Emergency Department (HOSPITAL_COMMUNITY)
Admission: EM | Admit: 2017-09-30 | Discharge: 2017-09-30 | Disposition: A | Discharge status: Home / Self Care | Payer: Self-pay | Attending: Emergency Medicine | Admitting: Emergency Medicine

## 2017-09-30 ENCOUNTER — Encounter (HOSPITAL_COMMUNITY): Payer: Self-pay | Admitting: *Deleted

## 2017-09-30 DIAGNOSIS — R0789 Other chest pain: Secondary | ICD-10-CM | POA: Insufficient documentation

## 2017-09-30 DIAGNOSIS — R079 Chest pain, unspecified: Secondary | ICD-10-CM

## 2017-09-30 DIAGNOSIS — F17200 Nicotine dependence, unspecified, uncomplicated: Secondary | ICD-10-CM | POA: Insufficient documentation

## 2017-09-30 DIAGNOSIS — R0602 Shortness of breath: Secondary | ICD-10-CM | POA: Insufficient documentation

## 2017-09-30 LAB — BASIC METABOLIC PANEL
Anion gap: 8 (ref 5–15)
BUN: 13 mg/dL (ref 6–20)
CHLORIDE: 111 mmol/L (ref 101–111)
CO2: 23 mmol/L (ref 22–32)
CREATININE: 0.98 mg/dL (ref 0.61–1.24)
Calcium: 9 mg/dL (ref 8.9–10.3)
GFR calc Af Amer: >60 mL/min (ref >60)
GFR calc non Af Amer: >60 mL/min (ref >60)
Glucose, Bld: 92 mg/dL (ref 65–99)
Potassium: 4.1 mmol/L (ref 3.5–5.1)
SODIUM: 142 mmol/L (ref 135–145)

## 2017-09-30 LAB — I-STAT TROPONIN, ED
TROPONIN I, POC: 0.05 ng/mL (ref 0.00–0.08)
Troponin i, poc: 0.06 ng/mL (ref 0.00–0.08)

## 2017-09-30 LAB — CBC
HCT: 43 % (ref 39.0–52.0)
Hemoglobin: 13.7 g/dL (ref 13.0–17.0)
MCH: 28.4 pg (ref 26.0–34.0)
MCHC: 31.9 g/dL (ref 30.0–36.0)
MCV: 89.2 fL (ref 78.0–100.0)
PLATELETS: 241 10*3/uL (ref 150–400)
RBC: 4.82 MIL/uL (ref 4.22–5.81)
RDW: 15.4 % (ref 11.5–15.5)
WBC: 5.8 10*3/uL (ref 4.0–10.5)

## 2017-09-30 NOTE — ED Notes (Signed)
Lab work, radiology results and vital signs reviewed, no critical results at this time, no change in acuity indicated.  

## 2017-09-30 NOTE — Discharge Instructions (Signed)
Your blood work, EKG and chest xray were reassuring.   Listed the information to [facility] which is a clinic located across the street from the hospital.  They accept patients who do not have insurance. Please call to establish care and for follow-up of your blood pressure which is elevated today.  Please return to the emergency department if you have worsening chest pain which radiates to the left arm or jaw, chest pain with worsening shortness of breath, chest pain with nausea/vomiting or have any new or changing symptoms.

## 2017-09-30 NOTE — ED Triage Notes (Signed)
To ED for eval of right side cp that woke pt from sleep. Pt denies burping/nausea/vomting. States he felt 'fine' yesterday. Complains of diaphoresis yesterday.

## 2017-09-30 NOTE — ED Notes (Signed)
ED Provider at bedside. 

## 2017-09-30 NOTE — ED Provider Notes (Signed)
[facility] EMERGENCY DEPARTMENT Provider Note   CSN: [id number] Arrival date & time: 09/30/17  0805     History   Chief Complaint Chief Complaint  Patient presents with  . Chest Pain    HPI Bubba Vanbenschoten is a 52 y.o. male.  HPI   Mr. Hunley is a 52 year old male with a history of tobacco use (30-pack-year), marijuana use (daily) who presents to the emergency department for evaluation of right-sided chest pain.  Patient states that he has had intermittent chest pain over the right chest for the last month, although it acutely worsened while at work yesterday.  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  It does not radiate. Pain occurs when he bends, twists or lifts.  He denies pain at rest.  He also endorses some shortness of breath with his chest pain.  He denies associated N/V. Reports he is generally sweaty and denies new diaphoresis. He has not taken any medication for his pain. He denies fevers, chills, headache, cough, sore throat, congestion, abdominal pain, numbness, weakness, dysuria, urinary.  Denies family history of MI.  Denies history of DVT/PE, pleuritic chest pain, leg swelling or tenderness, recent surgery or immobility, hemoptysis.  Reports that he has had more stress recently.  States that he does not have a regular doctor.  History reviewed. No pertinent past medical history.  There are no active problems to display for this patient.   Past Surgical History:  Procedure Laterality Date  . LEG SURGERY Left        Home Medications    Prior to Admission medications   Medication Sig Start Date End Date Taking? Authorizing Provider  ibuprofen (ADVIL,MOTRIN) 600 MG tablet Take 1 tablet (600 mg total) by mouth every 6 (six) hours as needed. 04/19/16   Leta Baptist, MD  naphazoline-glycerin (CLEAR EYES) 0.012-0.2 % SOLN Place 1-2  drops into both eyes 4 (four) times daily as needed for irritation.    [provider]    Family History No family history on file.  Social History Social History   Tobacco Use  . Smoking status: Current Every Day Smoker  . Smokeless tobacco: Never Used  Substance Use Topics  . Alcohol use: Yes  . Drug use: No     Allergies   Penicillins   Review of Systems Review of Systems  Constitutional: Negative for chills, fatigue and fever.  HENT: Negative for congestion and sore throat.   Eyes: Negative for visual disturbance.  Respiratory: Positive for shortness of breath. Negative for cough and wheezing.   Cardiovascular: Positive for chest pain. Negative for leg swelling.  Gastrointestinal: Negative for abdominal pain, nausea and vomiting.  Genitourinary: Negative for difficulty urinating, dysuria and frequency.  Musculoskeletal: Negative for gait problem.  Skin: Negative for rash.  Neurological: Negative for weakness, numbness and headaches.  Psychiatric/Behavioral: Negative for agitation.     Physical Exam Updated Vital Signs BP 132/76   Pulse (!) 58   Temp 98.2 F (36.8 C) (Oral)   Resp 17   SpO2 100%   Physical Exam  Constitutional: He is oriented to person, place, and time. He appears well-developed and well-nourished. No distress.  HENT:  Head: Normocephalic and atraumatic.  Mouth/Throat: Oropharynx is clear and moist. No oropharyngeal exudate.  Eyes: Conjunctivae are normal. Pupils are equal, round, and reactive to light. Right eye exhibits no discharge. Left  eye exhibits no discharge.  Neck: Normal range of motion. Neck supple. No JVD present. No tracheal deviation present.  Cardiovascular: Normal rate, regular rhythm and intact distal pulses. Exam reveals no friction rub.  No murmur heard. Radial pulses 2+ bilaterally.  Pulmonary/Chest: Effort normal and breath sounds normal. No stridor. No respiratory distress. He has no wheezes. He has no rales.    Chest wall nontender to palpation.  Abdominal: Soft. Bowel sounds are normal. There is no tenderness. There is no guarding.  Musculoskeletal: Normal range of motion.  No leg swelling or calf tenderness.  DP pulses 2+ bilaterally.  Neurological: He is alert and oriented to person, place, and time. Coordination normal.  Skin: Skin is warm and dry. Capillary refill takes less than 2 seconds. He is not diaphoretic.  Psychiatric: He has a normal mood and affect. His behavior is normal.  Nursing note and vitals reviewed.    ED Treatments / Results  Labs (all labs ordered are listed, but only abnormal results are displayed) Labs Reviewed  BASIC METABOLIC PANEL  CBC  I-STAT TROPONIN, ED  I-STAT TROPONIN, ED    EKG  EKG Interpretation  Date/Time:  Wednesday September 30 2017 08:25:40 EST Ventricular Rate:  72 PR Interval:  186 QRS Duration: 92 QT Interval:  378 QTC Calculation: 413 R Axis:   21 Text Interpretation:  Normal sinus rhythm Normal ECG No significant change since last tracing Confirmed by Jerelyn ScottLinker, Martha [phone]([id number]) on 09/30/2017 2:27:26 PM       Radiology Dg Chest 2 View  Result Date: 09/30/2017 CLINICAL DATA:  Central right-sided chest pain with shortness of breath. EXAM: CHEST  2 VIEW COMPARISON:  None. FINDINGS: The heart size and mediastinal contours are within normal limits. Both lungs are clear. The visualized skeletal structures are unremarkable. IMPRESSION: No active cardiopulmonary disease. Electronically Signed   By: Obie DredgeWilliam T Derry M.D.   On: 09/30/2017 09:13    Procedures Procedures (including critical care time)  Medications Ordered in ED Medications - No data to display   Initial Impression / Assessment and Plan / ED Course  I have reviewed the triage vital signs and the nursing notes.  Pertinent labs & imaging results that were available during my care of the patient were reviewed by me and considered in my medical decision making (see chart for  details).     Patient is to be discharged with recommendation to follow up with [facility] for his chest pain today. Chest pain is not likely of cardiac or pulmonary etiology d/t presentation, PERC negative, VSS, no tracheal deviation, no JVD or new murmur, RRR, breath sounds equal bilaterally, EKG without acute abnormalities, negative delta troponin, and negative CXR. Pt has been advised to return to the ED if CP becomes exertional, associated with diaphoresis or nausea, radiates to left jaw/arm, worsens or becomes concerning in any way.  His blood pressure was elevated in the emergency department today, counseled him to have this rechecked.  Pt appears reliable for follow up and is agreeable to discharge. Case has been discussed with Dr. Phineas RealMabe who agrees with the above plan to discharge.    Final Clinical Impressions(s) / ED Diagnoses   Final diagnoses:  Chest pain, unspecified type    ED Discharge Orders    None       Lawrence MarseillesShrosbree, Emily J, PA-C 09/30/17 2046    Phillis HaggisMabe, Martha L, MD 10/01/17 [phone]

## 2017-10-20 ENCOUNTER — Ambulatory Visit (HOSPITAL_COMMUNITY)
Admission: EM | Admit: 2017-10-20 | Discharge: 2017-10-20 | Disposition: A | Discharge status: Home / Self Care | Payer: Self-pay | Attending: Family Medicine | Admitting: Family Medicine

## 2017-10-20 ENCOUNTER — Other Ambulatory Visit: Payer: Self-pay

## 2017-10-20 ENCOUNTER — Encounter (HOSPITAL_COMMUNITY): Payer: Self-pay | Admitting: Emergency Medicine

## 2017-10-20 DIAGNOSIS — J111 Influenza due to unidentified influenza virus with other respiratory manifestations: Secondary | ICD-10-CM

## 2017-10-20 DIAGNOSIS — R05 Cough: Secondary | ICD-10-CM

## 2017-10-20 DIAGNOSIS — R059 Cough, unspecified: Secondary | ICD-10-CM

## 2017-10-20 DIAGNOSIS — R69 Illness, unspecified: Secondary | ICD-10-CM

## 2017-10-20 MED ORDER — BENZONATATE 100 MG PO CAPS: 100.0000 mg | ORAL_CAPSULE | Freq: Three times a day (TID) | ORAL | 0 refills | Status: DC

## 2017-10-20 NOTE — Discharge Instructions (Signed)

## 2017-10-20 NOTE — ED Triage Notes (Signed)
Pt states he has had a cough, nasal congestion chills, and diarrhea since Friday.  He reports most of those symptoms have subsided, but he still has a cough and he reports bloody sputum the last two days.

## 2017-10-20 NOTE — ED Provider Notes (Signed)
  [facility]   [id number] 10/20/17 Arrival Time: 1011  ASSESSMENT & PLAN:  1. Influenza-like illness   2. Cough     Meds ordered this encounter  Medications  . benzonatate (TESSALON) 100 MG capsule    Sig: Take 1 capsule (100 mg total) by mouth every 8 (eight) hours.    Dispense:  21 capsule    Refill:  0   Work note given. Discussed typical duration of symptoms. OTC symptom care as needed. Ensure adequate fluid intake and rest. May f/u with PCP or here as needed.  Reviewed expectations re: course of current medical issues. Questions answered. Outlined signs and symptoms indicating need for more acute intervention. Patient verbalized understanding. After Visit Summary given.   SUBJECTIVE: History from: patient.  Derek Huynh is a 52 y.o. male who presents with complaint of nasal congestion, post-nasal drainage, and a persistent dry cough. Onset abrupt, approximately 4-5 days ago. Symptoms are improving. Cough bothering him the most. Reports seeing "a little pink in some stuff I coughed up." No gross blood. Remains overall fatigued with mild body aches. SOB: none. Wheezing: none. Fever: no. Overall normal PO intake without n/v. Sick contacts: no. OTC treatment: cough medication without relief.  Received flu shot this year: no.  Social History   Tobacco Use  Smoking Status Current Every Day Smoker  Smokeless Tobacco Never Used    ROS: As per HPI.   OBJECTIVE:  Vitals:   10/20/17 1101  BP: 123/85  Pulse: 79  Temp: 98.7 F (37.1 C)  TempSrc: Oral  SpO2: 100%     General appearance: alert; appears fatigued HEENT: nasal congestion; clear runny nose; throat irritation secondary to post-nasal drainage Neck: supple without LAD Lungs: unlabored respirations, symmetrical air entry; cough: mild; no respiratory distress Skin: warm and dry Psychological: alert and cooperative; normal mood and affect   Allergies  Allergen Reactions  . Penicillins Other  (See Comments)    Unknown--childhood reaction Has patient had a PCN reaction causing immediate rash, facial/tongue/throat swelling, SOB or lightheadedness with hypotension:UNSURE Has patient had a PCN reaction causing severe rash involving mucus membranes or skin necrosis:unsure Has patient had a PCN reaction that required hospitalization:unsure Has patient had a PCN reaction occurring within the last 10 years:Yes If all of the above answers are "NO", then may proceed with Cephalosporin use.     Social History   Socioeconomic History  . Marital status: Legally Separated    Spouse name: Not on file  . Number of children: Not on file  . Years of education: Not on file  . Highest education level: Not on file  Social Needs  . Financial resource strain: Not on file  . Food insecurity - worry: Not on file  . Food insecurity - inability: Not on file  . Transportation needs - medical: Not on file  . Transportation needs - non-medical: Not on file  Occupational History  . Not on file  Tobacco Use  . Smoking status: Current Every Day Smoker  . Smokeless tobacco: Never Used  Substance and Sexual Activity  . Alcohol use: Yes  . Drug use: No  . Sexual activity: Not on file  Other Topics Concern  . Not on file  Social History Narrative  . Not on file           Mardella LaymanHagler, Kayla Deshaies, MD 10/20/17 1116

## 2019-06-26 ENCOUNTER — Encounter (HOSPITAL_COMMUNITY): Payer: Self-pay

## 2019-06-26 ENCOUNTER — Ambulatory Visit (HOSPITAL_COMMUNITY)
Admission: EM | Admit: 2019-06-26 | Discharge: 2019-06-26 | Disposition: A | Discharge status: Home / Self Care | Payer: Self-pay | Attending: Family Medicine | Admitting: Family Medicine

## 2019-06-26 ENCOUNTER — Other Ambulatory Visit: Payer: Self-pay

## 2019-06-26 NOTE — ED Notes (Signed)
Leigh , patient access reports patient arrived with another patient .  When the other patient left, this patient left

## 2019-06-26 NOTE — ED Triage Notes (Addendum)
Pt states he has a hernia x 3 months. Pt states he has left leg pain x 2 weeks.

## 2020-03-30 ENCOUNTER — Emergency Department (HOSPITAL_COMMUNITY): Payer: Managed Care, Other (non HMO)

## 2020-03-30 ENCOUNTER — Encounter (HOSPITAL_COMMUNITY): Payer: Self-pay | Admitting: Emergency Medicine

## 2020-03-30 ENCOUNTER — Emergency Department (HOSPITAL_COMMUNITY)
Admission: EM | Admit: 2020-03-30 | Discharge: 2020-03-30 | Disposition: A | Discharge status: Home / Self Care | Payer: Managed Care, Other (non HMO) | Attending: Emergency Medicine | Admitting: Emergency Medicine

## 2020-03-30 DIAGNOSIS — J40 Bronchitis, not specified as acute or chronic: Secondary | ICD-10-CM | POA: Diagnosis not present

## 2020-03-30 DIAGNOSIS — F17210 Nicotine dependence, cigarettes, uncomplicated: Secondary | ICD-10-CM | POA: Insufficient documentation

## 2020-03-30 DIAGNOSIS — R05 Cough: Secondary | ICD-10-CM | POA: Diagnosis present

## 2020-03-30 DIAGNOSIS — Z20822 Contact with and (suspected) exposure to covid-19: Secondary | ICD-10-CM | POA: Insufficient documentation

## 2020-03-30 LAB — COMPREHENSIVE METABOLIC PANEL
ALT: 17 U/L (ref 0–44)
AST: 31 U/L (ref 15–41)
Albumin: 4 g/dL (ref 3.5–5.0)
Alkaline Phosphatase: 59 U/L (ref 38–126)
Anion gap: 10 (ref 5–15)
BUN: 13 mg/dL (ref 6–20)
CO2: 22 mmol/L (ref 22–32)
Calcium: 9.2 mg/dL (ref 8.9–10.3)
Chloride: 106 mmol/L (ref 98–111)
Creatinine, Ser: 1.01 mg/dL (ref 0.61–1.24)
GFR calc Af Amer: >60 mL/min (ref >60)
GFR calc non Af Amer: >60 mL/min (ref >60)
Glucose, Bld: 113 mg/dL — ABNORMAL HIGH (ref 70–99)
Potassium: 4.2 mmol/L (ref 3.5–5.1)
Sodium: 138 mmol/L (ref 135–145)
Total Bilirubin: 0.6 mg/dL (ref 0.3–1.2)
Total Protein: 8.4 g/dL — ABNORMAL HIGH (ref 6.5–8.1)

## 2020-03-30 LAB — CBC
HCT: 44.8 % (ref 39.0–52.0)
Hemoglobin: 14.3 g/dL (ref 13.0–17.0)
MCH: 28.5 pg (ref 26.0–34.0)
MCHC: 31.9 g/dL (ref 30.0–36.0)
MCV: 89.2 fL (ref 80.0–100.0)
Platelets: 287 10*3/uL (ref 150–400)
RBC: 5.02 MIL/uL (ref 4.22–5.81)
RDW: 15 % (ref 11.5–15.5)
WBC: 9.9 10*3/uL (ref 4.0–10.5)
nRBC: 0 % (ref 0.0–0.2)

## 2020-03-30 LAB — SARS CORONAVIRUS 2 BY RT PCR (HOSPITAL ORDER, PERFORMED IN ~~LOC~~ HOSPITAL LAB): SARS Coronavirus 2: NEGATIVE

## 2020-03-30 MED ORDER — ALBUTEROL SULFATE HFA 108 (90 BASE) MCG/ACT IN AERS
2.0000 | INHALATION_SPRAY | Freq: Four times a day (QID) | RESPIRATORY_TRACT | 0 refills | Status: AC | PRN
Start: 2020-03-30 — End: ?

## 2020-03-30 MED ORDER — PREDNISONE 20 MG PO TABS
60.0000 mg | ORAL_TABLET | Freq: Once | ORAL | Status: AC
  Administered 2020-03-30: 60 mg via ORAL
  Filled 2020-03-30: qty 3

## 2020-03-30 MED ORDER — DOXYCYCLINE HYCLATE 100 MG PO CAPS
100.0000 mg | ORAL_CAPSULE | Freq: Two times a day (BID) | ORAL | 0 refills | Status: DC
Start: 2020-03-30 — End: 2021-12-09

## 2020-03-30 MED ORDER — PREDNISONE 20 MG PO TABS
40.0000 mg | ORAL_TABLET | Freq: Every day | ORAL | 0 refills | Status: AC
Start: 2020-03-30 — End: 2020-04-04

## 2020-03-30 MED ORDER — ALBUTEROL SULFATE HFA 108 (90 BASE) MCG/ACT IN AERS
2.0000 | INHALATION_SPRAY | Freq: Once | RESPIRATORY_TRACT | Status: AC
  Administered 2020-03-30: 2 via RESPIRATORY_TRACT
  Filled 2020-03-30: qty 6.7

## 2020-03-30 NOTE — ED Provider Notes (Addendum)
[facility] EMERGENCY DEPARTMENT Provider Note   CSN: [id number] Arrival date & time: 03/30/20  6720     History No chief complaint on file.   Derek Huynh is a 54 y.o. male with past medical history of cigarette use presents to the ED for evaluation of cough for 1 week.  Reports history of chronic cough from smoking that usually is only a little bit productive but states in the last week the cough has gotten worse, more frequent and the phlegm is yellow and brown in color.  Denies any shortness of breath but states taking deep breaths makes him start coughing.  Felt feverish the first day of symptoms but this resolved.  Has associated mild nasal congestion and rhinorrhea.  On vaccinated for COVID-19.  Denies any fevers, chills, sore throat, exertional chest pain or shortness of breath.  No changes in smell or taste.  No vomiting, diarrhea, abdominal pain, myalgias.  Occasionally smokes marijuana.  States this usually makes his cough worse.  HPI     No past medical history on file.  There are no problems to display for this patient.   Past Surgical History:  Procedure Laterality Date  . LEG SURGERY Left        No family history on file.  Social History   Tobacco Use  . Smoking status: Current Every Day Smoker  . Smokeless tobacco: Never Used  Substance Use Topics  . Alcohol use: Yes  . Drug use: No    Home Medications Prior to Admission medications   Medication Sig Start Date End Date Taking? Authorizing Provider  albuterol (VENTOLIN HFA) 108 (90 Base) MCG/ACT inhaler Inhale 2 puffs into the lungs every 6 (six) hours as needed for wheezing or shortness of breath. 03/30/20   Liberty Handy, PA-C  benzonatate (TESSALON) 100 MG capsule Take 1 capsule (100 mg total) by mouth every 8 (eight) hours. 10/20/17   Mardella Layman, MD  doxycycline (VIBRAMYCIN) 100 MG capsule Take 1 capsule (100 mg total) by mouth 2 (two) times daily. 03/30/20   Liberty Handy,  PA-C  ibuprofen (ADVIL,MOTRIN) 600 MG tablet Take 1 tablet (600 mg total) by mouth every 6 (six) hours as needed. Patient not taking: Reported on 09/30/2017 04/19/16   Leta Baptist, MD  naphazoline-glycerin (CLEAR EYES) 0.012-0.2 % SOLN Place 1-2 drops into both eyes 4 (four) times daily as needed for irritation.    [provider]  predniSONE (DELTASONE) 20 MG tablet Take 2 tablets (40 mg total) by mouth daily for 5 days. 03/30/20 04/04/20  Liberty Handy, PA-C    Allergies    Penicillins  Review of Systems   Review of Systems  HENT: Positive for congestion and rhinorrhea.   Respiratory: Positive for cough (productive).   All other systems reviewed and are negative.   Physical Exam Updated Vital Signs BP 123/84 (BP Location: Right Arm)   Pulse 83   Temp 98.2 F (36.8 C) (Oral)   Resp 19   Ht 5\' 6"  (1.676 m)   Wt 86.2 kg   SpO2 99%   BMI 30.67 kg/m   Physical Exam Vitals and nursing note reviewed.  Constitutional:      General: He is not in acute distress.    Appearance: He is well-developed.     Comments: NAD.  HENT:     Head: Normocephalic and atraumatic.     Right Ear: External ear normal.     Left Ear: External ear normal.  Nose: Congestion present.  Eyes:     General: No scleral icterus.    Conjunctiva/sclera: Conjunctivae normal.  Cardiovascular:     Rate and Rhythm: Normal rate and regular rhythm.     Heart sounds: Normal heart sounds. No murmur heard.   Pulmonary:     Effort: Pulmonary effort is normal.     Breath sounds: Wheezing present.     Comments: Normal work of breathing, speaking in full sentences.  Subtle end expiratory wheezing in lower lobes, R>L Musculoskeletal:        General: No deformity. Normal range of motion.     Cervical back: Normal range of motion and neck supple.  Skin:    General: Skin is warm and dry.     Capillary Refill: Capillary refill takes less than 2 seconds.  Neurological:     Mental Status: He is alert  and oriented to person, place, and time.  Psychiatric:        Behavior: Behavior normal.        Thought Content: Thought content normal.        Judgment: Judgment normal.     ED Results / Procedures / Treatments   Labs (all labs ordered are listed, but only abnormal results are displayed) Labs Reviewed  COMPREHENSIVE METABOLIC PANEL - Abnormal; Notable for the following components:      Result Value   Glucose, Bld 113 (*)    Total Protein 8.4 (*)    All other components within normal limits  SARS CORONAVIRUS 2 BY RT PCR (HOSPITAL ORDER, PERFORMED IN [facility])  CBC    EKG None  Radiology DG Chest 2 View  Result Date: 03/30/2020 CLINICAL DATA:  Cough, smoker EXAM: CHEST - 2 VIEW COMPARISON:  09/30/2017 FINDINGS: The heart size and mediastinal contours are within normal limits. Both lungs are clear. The visualized skeletal structures are unremarkable. IMPRESSION: No active cardiopulmonary disease. Electronically Signed   By: Helyn Numbers MD   On: 03/30/2020 08:38    Procedures Procedures (including critical care time)  Medications Ordered in ED Medications  predniSONE (DELTASONE) tablet 60 mg (60 mg Oral Given 03/30/20 1029)  albuterol (VENTOLIN HFA) 108 (90 Base) MCG/ACT inhaler 2 puff (2 puffs Inhalation Given 03/30/20 1028)    ED Course  I have reviewed the triage vital signs and the nursing notes.  Pertinent labs & imaging results that were available during my care of the patient were reviewed by me and considered in my medical decision making (see chart for details).    MDM Rules/Calculators/A&P                          This patient complains of worsening cough, sputum, nasal congestion.  History of cigarette and marijuana smoking.  I have reviewed patient's available medical records, triage nursing notes to obtain more history and assist with MDM.  Differential diagnosis includes viral lower respiratory infection, pneumonia, bronchitis.  He likely  has some COPD and exacerbations possible.  ER work-up initiated by triage RN including lab work and chest x-ray.  I have personally visualized and interpreted the labs and imaging studies.  These reveal normal white count, electrolytes.  Chest x-ray without pulmonary edema, infiltrates or opacities.  I have ordered Covid test.  Overall clinical presentation is most consistent with likely LRI/bronchitis.  He has subtle wheezing and wet productive cough during exam.  I have ordered prednisone and albuterol inhaler here.  We will discharge  with prednisone, albuterol, doxycycline.  Discussed appropriate use of Mucinex and Delsym for cough control at home.  Counseled patient on smoking and marijuana cessation.  Counseled patient on obtaining Covid vaccine.  I do not think further emergent lab work, imaging or admission is needed.  Vital signs normal.  No hypoxia, normal work of breathing.  Return precautions discussed.  He is comfortable with this plan.  Final Clinical Impression(s) / ED Diagnoses Final diagnoses:  Bronchitis    Rx / DC Orders ED Discharge Orders         Ordered    doxycycline (VIBRAMYCIN) 100 MG capsule  2 times daily     Discontinue  Reprint     03/30/20 1019    albuterol (VENTOLIN HFA) 108 (90 Base) MCG/ACT inhaler  Every 6 hours PRN     Discontinue  Reprint     03/30/20 1019    predniSONE (DELTASONE) 20 MG tablet  Daily     Discontinue  Reprint     03/30/20 1019           Liberty Handy, [place] 03/30/20 1031    Liberty Handy, PA-C 03/30/20 1034    Bethann Berkshire, MD 04/02/20 1009

## 2020-03-30 NOTE — ED Triage Notes (Signed)
Pt here from home with c/o a productive cough over the last few days , pt is a smoker , no fevers

## 2020-03-30 NOTE — Discharge Instructions (Addendum)
You were seen in the ED for cough  Lab work and chest x-ray were normal  We tested you for Covid.  The test result will come back in 2 hours.  You will be notified if it is positive.  You can check the results on your MyChart account.  Until you confirm your results, I recommend caution and wearing a mask.  I think your symptoms are from a virus or smoking that has caused bronchitis  Consider cutting back on cigarette and marijuana use  Use albuterol inhaler 2 puffs every 4-6 hours.  Take prednisone 40 mg daily for the next 5 days.  Doxycycline morning and night to cover for any bacterial infection.  Use Mucinex to help expectorate and cough up mucus.  Use Delsym to suppress the cough as needed.  Return to the ED for worsening symptoms, fever greater than 100.4, chest pain or shortness of breath with exertion

## 2020-03-30 NOTE — ED Notes (Signed)
Pt given dc instructions pt verbalizes understanding.  

## 2021-12-09 ENCOUNTER — Ambulatory Visit (HOSPITAL_COMMUNITY)
Admission: EM | Admit: 2021-12-09 | Discharge: 2021-12-09 | Disposition: A | Discharge status: Home / Self Care | Payer: Managed Care, Other (non HMO) | Attending: Family Medicine | Admitting: Family Medicine

## 2021-12-09 ENCOUNTER — Other Ambulatory Visit: Payer: Self-pay

## 2021-12-09 ENCOUNTER — Encounter (HOSPITAL_COMMUNITY): Payer: Self-pay

## 2021-12-09 DIAGNOSIS — J069 Acute upper respiratory infection, unspecified: Secondary | ICD-10-CM | POA: Diagnosis present

## 2021-12-09 DIAGNOSIS — Z20822 Contact with and (suspected) exposure to covid-19: Secondary | ICD-10-CM | POA: Insufficient documentation

## 2021-12-09 NOTE — ED Triage Notes (Signed)
Pt reports cough, congestion and nose bleed for several days. ?

## 2021-12-09 NOTE — Discharge Instructions (Addendum)
?  You have been swabbed for COVID, and the test will result in the next 24 hours. Our staff will call you if positive. If the test is positive, you should quarantine for 5 days.  ? ?You can try Mucinex D as needed for the congestion ? ? ?

## 2021-12-09 NOTE — ED Provider Notes (Signed)
?[facility] ? ? ? ?CSN: [id number] ?Arrival date & time: 12/09/21  1724 ? ? ?  ? ?History   ?Chief Complaint ?No chief complaint on file. ? ? ?HPI ?Derek Huynh is a 56 y.o. male.  ? ?HPI ?Here for cough and congestion and rhinorrhea for about 2 days.  No fever or chills.  No vomiting or diarrhea.  No ear pain or sore throat. ? ?He also notes occasionally has low blood on his toilet paper when he wipes and that this is happened a good bit throughout the time.  He does not have a primary doctor ? ?Also notes a new swelling around his bellybutton ? ?History reviewed. No pertinent past medical history. ? ?There are no problems to display for this patient. ? ? ?Past Surgical History:  ?Procedure Laterality Date  ? LEG SURGERY Left   ? ? ? ? ? ?Home Medications   ? ?Prior to Admission medications   ?Medication Sig Start Date End Date Taking? Authorizing Provider  ?albuterol (VENTOLIN HFA) 108 (90 Base) MCG/ACT inhaler Inhale 2 puffs into the lungs every 6 (six) hours as needed for wheezing or shortness of breath. 03/30/20   Liberty Handy, PA-C  ?naphazoline-glycerin (CLEAR EYES) 0.012-0.2 % SOLN Place 1-2 drops into both eyes 4 (four) times daily as needed for irritation.    [provider]  ? ? ?Family History ?History reviewed. No pertinent family history. ? ?Social History ?Social History  ? ?Tobacco Use  ? Smoking status: Every Day  ? Smokeless tobacco: Never  ?Substance Use Topics  ? Alcohol use: Yes  ? Drug use: No  ? ? ? ?Allergies   ?Penicillins ? ? ?Review of Systems ?Review of Systems ? ? ?Physical Exam ?Triage Vital Signs ?ED Triage Vitals [12/09/21 1847]  ?Enc Vitals Group  ?   BP (!) 153/88  ?   Pulse Rate 78  ?   Resp 18  ?   Temp 98 ?F (36.7 ?C)  ?   Temp Source Oral  ?   SpO2 100 %  ?   Weight   ?   Height   ?   Head Circumference   ?   Peak Flow   ?   Pain Score   ?   Pain Loc   ?   Pain Edu?   ?   Excl. in GC?   ? ?No data found. ? ?Updated Vital Signs ?BP (!) 153/88 (BP Location:  Left Arm)   Pulse 78   Temp 98 ?F (36.7 ?C) (Oral)   Resp 18   SpO2 100%  ? ?Visual Acuity ?Right Eye Distance:   ?Left Eye Distance:   ?Bilateral Distance:   ? ?Right Eye Near:   ?Left Eye Near:    ?Bilateral Near:    ? ?Physical Exam ?Vitals reviewed.  ?Constitutional:   ?   General: He is not in acute distress. ?   Appearance: He is not toxic-appearing.  ?HENT:  ?   Right Ear: Tympanic membrane and ear canal normal.  ?   Left Ear: Tympanic membrane and ear canal normal.  ?   Nose: Nose normal.  ?   Mouth/Throat:  ?   Mouth: Mucous membranes are moist.  ?   Pharynx: No posterior oropharyngeal erythema.  ?   Comments: There is clear mucus draining in the posterior oropharynx ?Eyes:  ?   Extraocular Movements: Extraocular movements intact.  ?   Conjunctiva/sclera: Conjunctivae normal.  ?   Pupils:  Pupils are equal, round, and reactive to light.  ?Cardiovascular:  ?   Rate and Rhythm: Normal rate and regular rhythm.  ?   Heart sounds: No murmur heard. ?Pulmonary:  ?   Effort: Pulmonary effort is normal.  ?   Breath sounds: Normal breath sounds.  ?Abdominal:  ?   Palpations: Abdomen is soft.  ?   Tenderness: There is no abdominal tenderness.  ?   Hernia: A hernia (There is an umbilical hernia about 3 cm in diameter that is soft and reducible) is present.  ?Musculoskeletal:  ?   Cervical back: Neck supple.  ?Lymphadenopathy:  ?   Cervical: No cervical adenopathy.  ?Skin: ?   Capillary Refill: Capillary refill takes less than 2 seconds.  ?   Coloration: Skin is not jaundiced or pale.  ?Neurological:  ?   Mental Status: He is alert and oriented to person, place, and time.  ?Psychiatric:     ?   Behavior: Behavior normal.  ? ? ? ?UC Treatments / Results  ?Labs ?(all labs ordered are listed, but only abnormal results are displayed) ?Labs Reviewed  ?SARS CORONAVIRUS 2 (TAT 6-24 HRS)  ? ? ?EKG ? ? ?Radiology ?No results found. ? ?Procedures ?Procedures (including critical care time) ? ?Medications Ordered in  UC ?Medications - No data to display ? ?Initial Impression / Assessment and Plan / UC Course  ?I have reviewed the triage vital signs and the nursing notes. ? ?Pertinent labs & imaging results that were available during my care of the patient were reviewed by me and considered in my medical decision making (see chart for details). ? ?  ? ?COVID swab so he knows if he needs to quarantine.  Request is made to help him find a primary care office.  Discussed that he may need to see gastro and that he may need to see a general surgeon ?Final Clinical Impressions(s) / UC Diagnoses  ? ?Final diagnoses:  ?Acute upper respiratory infection  ? ? ? ?Discharge Instructions   ? ?  ? ?You have been swabbed for COVID, and the test will result in the next 24 hours. Our staff will call you if positive. If the test is positive, you should quarantine for 5 days.  ? ?You can try Mucinex D as needed for the congestion ? ? ? ? ? ? ?ED Prescriptions   ?None ?  ? ?PDMP not reviewed this encounter. ?  ?Zenia Resides, MD ?12/09/21 1913 ? ?

## 2021-12-10 LAB — SARS CORONAVIRUS 2 (TAT 6-24 HRS): SARS Coronavirus 2: NEGATIVE

## 2022-02-02 ENCOUNTER — Encounter (HOSPITAL_COMMUNITY): Payer: Self-pay | Admitting: Emergency Medicine

## 2022-02-02 ENCOUNTER — Ambulatory Visit (HOSPITAL_COMMUNITY)
Admission: EM | Admit: 2022-02-02 | Discharge: 2022-02-02 | Disposition: A | Discharge status: Home / Self Care | Payer: Managed Care, Other (non HMO) | Attending: Internal Medicine | Admitting: Internal Medicine

## 2022-02-02 ENCOUNTER — Ambulatory Visit (INDEPENDENT_AMBULATORY_CARE_PROVIDER_SITE_OTHER): Payer: Managed Care, Other (non HMO)

## 2022-02-02 DIAGNOSIS — S93401A Sprain of unspecified ligament of right ankle, initial encounter: Secondary | ICD-10-CM

## 2022-02-02 DIAGNOSIS — M25571 Pain in right ankle and joints of right foot: Secondary | ICD-10-CM | POA: Diagnosis not present

## 2022-02-02 MED ORDER — KETOROLAC TROMETHAMINE 30 MG/ML IJ SOLN
INTRAMUSCULAR | Status: AC
  Filled 2022-02-02: qty 1

## 2022-02-02 MED ORDER — KETOROLAC TROMETHAMINE 30 MG/ML IJ SOLN
30.0000 mg | Freq: Once | INTRAMUSCULAR | Status: AC
  Administered 2022-02-02: 30 mg via INTRAMUSCULAR

## 2022-02-02 MED ORDER — NAPROXEN 500 MG PO TABS: 500.0000 mg | ORAL_TABLET | Freq: Two times a day (BID) | ORAL | 0 refills | Status: AC

## 2022-02-02 NOTE — ED Provider Notes (Signed)
?[facility] ? ? ? ?CSN: [id number] ?Arrival date & time: 02/02/22  1430 ? ? ?  ? ?History   ?Chief Complaint ?Chief Complaint  ?Patient presents with  ? Ankle Pain  ? ? ?HPI ?Derek Huynh is a 56 y.o. male.  ? ?Patient presents to urgent care for evaluation of his left ankle pain that he sustained yesterday when he was trying to break up a fight between a couple of puppies at his house.  Denies any twisting injury, but states that his right ankle is more swollen than his left ankle.  He states that his pain is worse when he bears weight on his right ankle and mostly hurts inferior to his medial malleolus.  Denies any numbness or tingling to right foot. He denies past injury to his right ankle. He has not taken any medication for his pain at home and has been walking with a cane since the ankle pain started yesterday. His pain is currently a 6 on a scale of 0-10 and he describes this as an ache.  He also wishes to establish care with a primary care provider and is requesting assistance with this. Denies any other aggravating or relieving factors. Has not applied ice to his ankle or elevated it.  ? ? ?Ankle Pain ? ?History reviewed. No pertinent past medical history. ? ?There are no problems to display for this patient. ? ? ?Past Surgical History:  ?Procedure Laterality Date  ? LEG SURGERY Left   ? ? ? ? ? ?Home Medications   ? ?Prior to Admission medications   ?Medication Sig Start Date End Date Taking? Authorizing Provider  ?naproxen (NAPROSYN) 500 MG tablet Take 1 tablet (500 mg total) by mouth 2 (two) times daily. 02/02/22  Yes Carlisle Beers, FNP  ?albuterol (VENTOLIN HFA) 108 (90 Base) MCG/ACT inhaler Inhale 2 puffs into the lungs every 6 (six) hours as needed for wheezing or shortness of breath. 03/30/20   Liberty Handy, PA-C  ? ? ?Family History ?No family history on file. ? ?Social History ?Social History  ? ?Tobacco Use  ? Smoking status: Every Day  ? Smokeless tobacco: Never  ?Substance  Use Topics  ? Alcohol use: Yes  ? Drug use: No  ? ? ? ?Allergies   ?Penicillins ? ? ?Review of Systems ?Review of Systems ?Per HPI ? ?Physical Exam ?Triage Vital Signs ?ED Triage Vitals  ?Enc Vitals Group  ?   BP 02/02/22 1447 125/82  ?   Pulse Rate 02/02/22 1447 60  ?   Resp 02/02/22 1447 17  ?   Temp 02/02/22 1447 97.9 ?F (36.6 ?C)  ?   Temp Source 02/02/22 1447 Oral  ?   SpO2 02/02/22 1447 97 %  ?   Weight --   ?   Height --   ?   Head Circumference --   ?   Peak Flow --   ?   Pain Score 02/02/22 1446 8  ?   Pain Loc --   ?   Pain Edu? --   ?   Excl. in GC? --   ? ?No data found. ? ?Updated Vital Signs ?BP 125/82 (BP Location: Right Arm)   Pulse 60   Temp 97.9 ?F (36.6 ?C) (Oral)   Resp 17   SpO2 97%  ? ?Visual Acuity ?Right Eye Distance:   ?Left Eye Distance:   ?Bilateral Distance:   ? ?Right Eye Near:   ?Left Eye Near:    ?Bilateral  Near:    ? ?Physical Exam ?Vitals and nursing note reviewed.  ?Constitutional:   ?   General: He is not in acute distress. ?   Appearance: Normal appearance. He is well-developed. He is not ill-appearing.  ?   Comments: Very pleasant patient sitting comfortably on exam able in no acute distress.   ?HENT:  ?   Head: Normocephalic and atraumatic.  ?   Right Ear: Tympanic membrane, ear canal and external ear normal.  ?   Left Ear: Tympanic membrane, ear canal and external ear normal.  ?   Nose: Nose normal.  ?   Mouth/Throat:  ?   Mouth: Mucous membranes are moist.  ?   Pharynx: No posterior oropharyngeal erythema.  ?Eyes:  ?   General: Lids are normal. Vision grossly intact. Gaze aligned appropriately.  ?   Extraocular Movements: Extraocular movements intact.  ?   Conjunctiva/sclera: Conjunctivae normal.  ?   Right eye: Right conjunctiva is not injected.  ?   Left eye: Left conjunctiva is not injected.  ?Cardiovascular:  ?   Rate and Rhythm: Normal rate and regular rhythm.  ?   Pulses:     ?     Dorsalis pedis pulses are 2+ on the right side and 2+ on the left side.  ?      Posterior tibial pulses are 2+ on the right side and 2+ on the left side.  ?   Heart sounds: Normal heart sounds, S1 normal and S2 normal. No murmur heard. ?Pulmonary:  ?   Effort: Pulmonary effort is normal. No respiratory distress.  ?   Breath sounds: Normal breath sounds. No decreased air movement.  ?Abdominal:  ?   General: There is no distension.  ?   Palpations: Abdomen is soft.  ?   Tenderness: There is no abdominal tenderness. There is no right CVA tenderness or left CVA tenderness.  ?Musculoskeletal:     ?   General: Swelling and tenderness present.  ?   Cervical back: Neck supple.  ?   Right lower leg: No edema.  ?   Left lower leg: No edema.  ?   Right foot: Normal range of motion and normal capillary refill. Swelling, tenderness and bony tenderness present. No foot drop or prominent metatarsal heads. Normal pulse.  ?   Left foot: Normal range of motion. No swelling, foot drop or prominent metatarsal heads.  ?   Comments: Tenderness is mostly localized to the medial malleolus of patient's right ankle.  ?Feet:  ?   Right foot:  ?   Skin integrity: Skin integrity normal.  ?   Toenail Condition: Right toenails are normal.  ?   Left foot:  ?   Skin integrity: Skin integrity normal.  ?   Toenail Condition: Left toenails are normal.  ?   Comments: 5/5 strength against resistance with dorsiflexion and plantar flexion. Normal sensation to bilateral feet. <2 second capillary refill to toes bilaterally. Neurovascularly intact. Mild tenderness to active range of motion, but ROM is not limited by tenderness.  ?Lymphadenopathy:  ?   Cervical: No cervical adenopathy.  ?Skin: ?   General: Skin is warm and dry.  ?   Capillary Refill: Capillary refill takes less than 2 seconds.  ?   Findings: No rash.  ?Neurological:  ?   General: No focal deficit present.  ?   Mental Status: He is alert and oriented to person, place, and time. Mental status is at baseline.  ?  Gait: Gait is intact.  ?Psychiatric:     ?   Attention and  Perception: Attention and perception normal.     ?   Mood and Affect: Mood normal.     ?   Speech: Speech normal.     ?   Behavior: Behavior normal. Behavior is cooperative.     ?   Thought Content: Thought content normal.     ?   Cognition and Memory: Cognition and memory normal.     ?   Judgment: Judgment normal.  ? ? ? ?UC Treatments / Results  ?Labs ?(all labs ordered are listed, but only abnormal results are displayed) ?Labs Reviewed - No data to display ? ?EKG ? ? ?Radiology ?DG Ankle Complete Right ? ?Result Date: 02/02/2022 ?CLINICAL DATA:  Pain and swelling EXAM: RIGHT ANKLE - COMPLETE 3+ VIEW COMPARISON:  None Available. FINDINGS: There is no evidence of fracture, dislocation, or joint effusion. There is no evidence of arthropathy or other focal bone abnormality. Soft tissues are unremarkable. IMPRESSION: No acute osseous abnormality identified. Electronically Signed   By: Jannifer Hick M.D.   On: 02/02/2022 16:29   ? ?Procedures ?Procedures (including critical care time) ? ?Medications Ordered in UC ?Medications  ?ketorolac (TORADOL) 30 MG/ML injection 30 mg (30 mg Intramuscular Given 02/02/22 1627)  ? ? ?Initial Impression / Assessment and Plan / UC Course  ?I have reviewed the triage vital signs and the nursing notes. ? ?Pertinent labs & imaging results that were available during my care of the patient were reviewed by me and considered in my medical decision making (see chart for details). ? ?Derek Huynh is a 56 year old male who presents urgent care today with right sided ankle pain that is mostly localized to the medial malleolus.  Ankle x-ray performed in clinic today to assess for acute bony abnormality.  X-ray shows no acute bony abnormality. Plan to treat for ankle sprain to right ankle.  ? ?Ketorolac 30 mg injection given in clinic today for pain management.  Plan to prescribe naproxen to be taken twice daily for the next 5 days starting tomorrow due to ketorolac injection in clinic today.   Advised patient not to take any other NSAID containing medications while taking naproxen for ankle sprain.  Ankle brace applied today for compression and stabilization of ankle.  He is instructed to rest, ice, elev

## 2022-02-02 NOTE — Discharge Instructions (Addendum)
You were seen in urgent care today for your ankle sprain.  I gave you medicine in the clinic today called ketorolac that is a stronger version of ibuprofen to help with your pain.   ? ?Start taking naproxen pain medication tomorrow morning and take this once in the morning and once at night with food for the next 5 days.  This will help with pain and inflammation due to your ankle sprain. ? ?Wear the ankle brace provided to urgent care today for the next week.  I have given you a work note for the next 3 days so that you are able to rest, ice, compress, and elevate your ankle to allow it to heal faster. ?You will be receiving information from someone at [facility] regarding setting up a primary care appointment soon. ? ?If you develop any new or worsening symptoms or do not improve in the next 2 to 3 days, please return.  If your symptoms are severe, please go to the emergency room.  Follow-up with your primary care provider for further evaluation and management of your symptoms as well as ongoing wellness visits.  I hope you feel better! ?

## 2022-02-02 NOTE — ED Triage Notes (Signed)
Pt reports that yesterday tried to break up the puppies from fighting but denies any injury. Today has swelling and pain with right ankle.  ?

## 2022-04-25 ENCOUNTER — Emergency Department (HOSPITAL_COMMUNITY): Payer: Managed Care, Other (non HMO)

## 2022-04-25 ENCOUNTER — Emergency Department (HOSPITAL_COMMUNITY)
Admission: EM | Admit: 2022-04-25 | Discharge: 2022-04-25 | Disposition: A | Discharge status: Home / Self Care | Payer: Managed Care, Other (non HMO) | Attending: Emergency Medicine | Admitting: Emergency Medicine

## 2022-04-25 ENCOUNTER — Other Ambulatory Visit: Payer: Self-pay

## 2022-04-25 ENCOUNTER — Encounter (HOSPITAL_COMMUNITY): Payer: Self-pay | Admitting: Emergency Medicine

## 2022-04-25 DIAGNOSIS — N503 Cyst of epididymis: Secondary | ICD-10-CM | POA: Insufficient documentation

## 2022-04-25 DIAGNOSIS — N50812 Left testicular pain: Secondary | ICD-10-CM | POA: Diagnosis present

## 2022-04-25 LAB — URINALYSIS, ROUTINE W REFLEX MICROSCOPIC
Bilirubin Urine: NEGATIVE
Glucose, UA: NEGATIVE mg/dL
Hgb urine dipstick: NEGATIVE
Ketones, ur: NEGATIVE mg/dL
Leukocytes,Ua: NEGATIVE
Nitrite: NEGATIVE
Protein, ur: NEGATIVE mg/dL
Specific Gravity, Urine: 1.02 (ref 1.005–1.030)
pH: 5 (ref 5.0–8.0)

## 2022-04-25 NOTE — ED Provider Notes (Signed)
[facility] EMERGENCY DEPARTMENT Provider Note   CSN: [id number] Arrival date & time: 04/25/22  1751     History  Chief Complaint  Patient presents with   Testicle Pain    Derek Huynh is a 56 y.o. male.  Patient here with left testicular pain.  Has had some on and off for a long time.  Denies any pain with urination.  Denies any fevers or chills.  Nothing makes it worse or better.  Denies any abdominal pain, nausea, vomiting.  No concern for STD.  The history is provided by the patient.       Home Medications Prior to Admission medications   Medication Sig Start Date End Date Taking? Authorizing Provider  albuterol (VENTOLIN HFA) 108 (90 Base) MCG/ACT inhaler Inhale 2 puffs into the lungs every 6 (six) hours as needed for wheezing or shortness of breath. 03/30/20   Liberty Handy, PA-C  naproxen (NAPROSYN) 500 MG tablet Take 1 tablet (500 mg total) by mouth 2 (two) times daily. 02/02/22   Carlisle Beers, FNP      Allergies    Penicillins    Review of Systems   Review of Systems  Physical Exam Updated Vital Signs BP (!) 154/89 (BP Location: Right Arm)   Pulse 61   Temp 98.7 F (37.1 C) (Oral)   Resp 16   SpO2 100%  Physical Exam Vitals and nursing note reviewed.  Constitutional:      General: He is not in acute distress.    Appearance: He is well-developed.  HENT:     Head: Normocephalic and atraumatic.  Eyes:     Conjunctiva/sclera: Conjunctivae normal.  Cardiovascular:     Rate and Rhythm: Normal rate and regular rhythm.     Heart sounds: No murmur heard. Pulmonary:     Effort: Pulmonary effort is normal. No respiratory distress.     Breath sounds: Normal breath sounds.  Abdominal:     Palpations: Abdomen is soft.     Tenderness: There is no abdominal tenderness.  Genitourinary:    Penis: Normal.      Comments: Left testicle tender over the epididymis area, there is no major swelling or crepitus in the GU  area Musculoskeletal:        General: No swelling.     Cervical back: Neck supple.  Skin:    General: Skin is warm and dry.     Capillary Refill: Capillary refill takes less than 2 seconds.  Neurological:     Mental Status: He is alert.  Psychiatric:        Mood and Affect: Mood normal.     ED Results / Procedures / Treatments   Labs (all labs ordered are listed, but only abnormal results are displayed) Labs Reviewed  URINALYSIS, ROUTINE W REFLEX MICROSCOPIC    EKG None  Radiology US SCROTUM W/DOPPLER  Result Date: 04/25/2022 CLINICAL DATA:  Left testicular pain EXAM: SCROTAL ULTRASOUND DOPPLER ULTRASOUND OF THE TESTICLES TECHNIQUE: Complete ultrasound examination of the testicles, epididymis, and other scrotal structures was performed. Color and spectral Doppler ultrasound were also utilized to evaluate blood flow to the testicles. COMPARISON:  04/19/2016 FINDINGS: Right testicle Measurements: 0.1 x 2.0 x 3.0 cm. No mass or microlithiasis visualized. Left testicle Measurements: 3.4 x 2.0 x 3.0 cm. No mass or microlithiasis visualized. Right epididymis:  Normal in size and appearance. Left epididymis: Stable appearing left epididymal cyst is noted measuring up to 3.4 cm. The overall appearance is similar to  that noted on the prior exam. Hydrocele:  None visualized. Varicocele:  None visualized. Pulsed Doppler interrogation of both testes demonstrates normal low resistance arterial and venous waveforms bilaterally. IMPRESSION: Stable large left epididymal cyst. No new focal abnormality is noted. Electronically Signed   By: Alcide Clever M.D.   On: 04/25/2022 19:35    Procedures Procedures    Medications Ordered in ED Medications - No data to display  ED Course/ Medical Decision Making/ A&P                           Medical Decision Making  Derek Huynh is here with left testicular pain.  Differential diagnosis is epididymitis versus orchitis.  We will get ultrasound of the  testicles and scrotum.  We will get urinalysis.  Vital signs are normal.  Urinalysis per my review and interpretation negative for infection.  Ultrasound shows stable left large epididymal cyst.  There is no infectious process or torsion.  Overall this appears to be a chronic process.  He is never seen urologist about this.  We will refer him to urology.  Discharged in good condition.  This chart was dictated using voice recognition software.  Despite best efforts to proofread,  errors can occur which can change the documentation meaning.         Final Clinical Impression(s) / ED Diagnoses Final diagnoses:  Epididymal cyst    Rx / DC Orders ED Discharge Orders     None         Virgina Norfolk, DO 04/25/22 2025

## 2022-04-25 NOTE — ED Notes (Signed)
Discharge instructions reviewed with patient. Patient verbalized understanding of instructions. Follow-up care and medications were reviewed. Patient ambulatory with steady gait. VSS upon discharge.  ?

## 2022-04-25 NOTE — Discharge Instructions (Signed)
You have a epididymal cyst.  Follow-up with urology.  Tylenol and ibuprofen for pain.

## 2022-04-25 NOTE — ED Triage Notes (Signed)
Pt with swelling and testicle pain "for a while"  But has gotten worse today. No urinary symptoms  No n/v/d   Does report umbilical hernia

## 2022-04-25 NOTE — ED Provider Triage Note (Signed)
Emergency Medicine Provider Triage Evaluation Note  Derek Huynh , a 56 y.o. male  was evaluated in triage.  Pt complains of testicular pain for months.  He states that its been worse over the past 2 days.  He describes having pain and swelling of the testicles.  Denies dysuria, hematuria, penile discharge.  Denies fevers or abdominal pain..  Review of Systems  Positive:  Negative:   Physical Exam  There were no vitals taken for this visit. Gen:   Awake, no distress   Resp:  Normal effort  MSK:   Moves extremities without difficulty  Other:  GU exam performed with nurse tech in room to chaperone.  Circumcised penis.  No penile discharge.  Testicles are in vertical lie without obvious torsion.  He has tenderness to palpation of the left epididymis.  No obvious varicocele, hydrocele  Medical Decision Making  Medically screening exam initiated at 6:28 PM.  Appropriate orders placed.  Kalid Ghan was informed that the remainder of the evaluation will be completed by another provider, this initial triage assessment does not replace that evaluation, and the importance of remaining in the ED until their evaluation is complete.     Cristopher Peru, PA-C 04/25/22 [phone]
